# Patient Record
Sex: Female | Born: 1995
Health system: Southern US, Community
[De-identification: ages and names within clinical notes are randomized; demographics above are authoritative.]

## PROBLEM LIST (undated history)

## (undated) DIAGNOSIS — F329 Major depressive disorder, single episode, unspecified: Secondary | ICD-10-CM

## (undated) DIAGNOSIS — D649 Anemia, unspecified: Secondary | ICD-10-CM

## (undated) DIAGNOSIS — F919 Conduct disorder, unspecified: Secondary | ICD-10-CM

## (undated) DIAGNOSIS — F319 Bipolar disorder, unspecified: Secondary | ICD-10-CM

## (undated) DIAGNOSIS — F32A Depression, unspecified: Secondary | ICD-10-CM

## (undated) DIAGNOSIS — J45909 Unspecified asthma, uncomplicated: Secondary | ICD-10-CM

## (undated) DIAGNOSIS — R4689 Other symptoms and signs involving appearance and behavior: Secondary | ICD-10-CM

## (undated) DIAGNOSIS — F913 Oppositional defiant disorder: Secondary | ICD-10-CM

## (undated) DIAGNOSIS — E039 Hypothyroidism, unspecified: Secondary | ICD-10-CM

## (undated) DIAGNOSIS — F419 Anxiety disorder, unspecified: Secondary | ICD-10-CM

## (undated) DIAGNOSIS — T50901A Poisoning by unspecified drugs, medicaments and biological substances, accidental (unintentional), initial encounter: Secondary | ICD-10-CM

## (undated) HISTORY — PX: UPPER GI ENDOSCOPY: SHX6162

---
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
## 2017-02-21 ENCOUNTER — Inpatient Hospital Stay (HOSPITAL_COMMUNITY)
Admission: EM | Admit: 2017-02-21 | Discharge: 2017-02-23 | DRG: 918 | Disposition: A | Discharge status: Other Acute Inpatient Hospital | Payer: Medicaid Other | Attending: Internal Medicine | Admitting: Internal Medicine

## 2017-02-21 ENCOUNTER — Encounter (HOSPITAL_COMMUNITY): Payer: Self-pay | Admitting: *Deleted

## 2017-02-21 DIAGNOSIS — T50902A Poisoning by unspecified drugs, medicaments and biological substances, intentional self-harm, initial encounter: Secondary | ICD-10-CM | POA: Diagnosis not present

## 2017-02-21 DIAGNOSIS — T426X2A Poisoning by other antiepileptic and sedative-hypnotic drugs, intentional self-harm, initial encounter: Principal | ICD-10-CM | POA: Diagnosis present

## 2017-02-21 DIAGNOSIS — F4310 Post-traumatic stress disorder, unspecified: Secondary | ICD-10-CM | POA: Diagnosis present

## 2017-02-21 DIAGNOSIS — S134XXA Sprain of ligaments of cervical spine, initial encounter: Secondary | ICD-10-CM

## 2017-02-21 DIAGNOSIS — Z91018 Allergy to other foods: Secondary | ICD-10-CM

## 2017-02-21 DIAGNOSIS — Z881 Allergy status to other antibiotic agents status: Secondary | ICD-10-CM

## 2017-02-21 DIAGNOSIS — Z880 Allergy status to penicillin: Secondary | ICD-10-CM

## 2017-02-21 DIAGNOSIS — T50901A Poisoning by unspecified drugs, medicaments and biological substances, accidental (unintentional), initial encounter: Secondary | ICD-10-CM

## 2017-02-21 DIAGNOSIS — F603 Borderline personality disorder: Secondary | ICD-10-CM | POA: Diagnosis present

## 2017-02-21 DIAGNOSIS — Z9102 Food additives allergy status: Secondary | ICD-10-CM

## 2017-02-21 DIAGNOSIS — F319 Bipolar disorder, unspecified: Secondary | ICD-10-CM | POA: Diagnosis present

## 2017-02-21 DIAGNOSIS — F17210 Nicotine dependence, cigarettes, uncomplicated: Secondary | ICD-10-CM | POA: Diagnosis present

## 2017-02-21 DIAGNOSIS — Z915 Personal history of self-harm: Secondary | ICD-10-CM

## 2017-02-21 DIAGNOSIS — Z91010 Allergy to peanuts: Secondary | ICD-10-CM

## 2017-02-21 LAB — CBC WITH DIFFERENTIAL/PLATELET
Basophils Absolute: 0.1 10*3/uL (ref 0.0–0.1)
Basophils Relative: 0 %
EOS PCT: 0 %
Eosinophils Absolute: 0.1 10*3/uL (ref 0.0–0.7)
HEMATOCRIT: 44.5 % (ref 36.0–46.0)
Hemoglobin: 15 g/dL (ref 12.0–15.0)
LYMPHS ABS: 1.8 10*3/uL (ref 0.7–4.0)
LYMPHS PCT: 10 %
MCH: 28.8 pg (ref 26.0–34.0)
MCHC: 33.7 g/dL (ref 30.0–36.0)
MCV: 85.4 fL (ref 78.0–100.0)
MONO ABS: 0.5 10*3/uL (ref 0.1–1.0)
MONOS PCT: 3 %
NEUTROS ABS: 16 10*3/uL — AB (ref 1.7–7.7)
Neutrophils Relative %: 87 %
PLATELETS: 235 10*3/uL (ref 150–400)
RBC: 5.21 MIL/uL — ABNORMAL HIGH (ref 3.87–5.11)
RDW: 13 % (ref 11.5–15.5)
WBC: 18.4 10*3/uL — ABNORMAL HIGH (ref 4.0–10.5)

## 2017-02-21 LAB — COMPREHENSIVE METABOLIC PANEL
ALT: 19 U/L (ref 14–54)
AST: 50 U/L — AB (ref 15–41)
Albumin: 4.9 g/dL (ref 3.5–5.0)
Alkaline Phosphatase: 87 U/L (ref 38–126)
Anion gap: 13 (ref 5–15)
BUN: 10 mg/dL (ref 6–20)
CALCIUM: 9.5 mg/dL (ref 8.9–10.3)
CO2: 21 mmol/L — AB (ref 22–32)
CREATININE: 0.73 mg/dL (ref 0.44–1.00)
Chloride: 106 mmol/L (ref 101–111)
GFR calc Af Amer: >60 mL/min (ref >60)
GFR calc non Af Amer: >60 mL/min (ref >60)
GLUCOSE: 95 mg/dL (ref 65–99)
Potassium: 4.4 mmol/L (ref 3.5–5.1)
Sodium: 140 mmol/L (ref 135–145)
Total Bilirubin: 0.2 mg/dL — ABNORMAL LOW (ref 0.3–1.2)
Total Protein: 8.6 g/dL — ABNORMAL HIGH (ref 6.5–8.1)

## 2017-02-21 LAB — RAPID URINE DRUG SCREEN, HOSP PERFORMED
AMPHETAMINES: NOT DETECTED
Barbiturates: NOT DETECTED
Benzodiazepines: NOT DETECTED
Cocaine: NOT DETECTED
OPIATES: NOT DETECTED
TETRAHYDROCANNABINOL: POSITIVE — AB

## 2017-02-21 LAB — I-STAT BETA HCG BLOOD, ED (MC, WL, AP ONLY): I-stat hCG, quantitative: <5 m[IU]/mL (ref <5)

## 2017-02-21 LAB — ACETAMINOPHEN LEVEL: Acetaminophen (Tylenol), Serum: <10 ug/mL — ABNORMAL LOW (ref 10–30)

## 2017-02-21 LAB — SALICYLATE LEVEL: Salicylate Lvl: <7 mg/dL (ref 2.8–30.0)

## 2017-02-21 MED ORDER — KETOROLAC TROMETHAMINE 30 MG/ML IJ SOLN
30.0000 mg | Freq: Once | INTRAMUSCULAR | Status: AC
  Administered 2017-02-21: 30 mg via INTRAVENOUS
  Filled 2017-02-21: qty 1

## 2017-02-21 MED ORDER — SODIUM CHLORIDE 0.9 % IV BOLUS (SEPSIS)
1000.0000 mL | Freq: Once | INTRAVENOUS | Status: AC
  Administered 2017-02-21: 1000 mL via INTRAVENOUS

## 2017-02-21 NOTE — ED Notes (Signed)
Bed: WA08 Expected date:  Expected time:  Means of arrival:  Comments: OD on lamictal

## 2017-02-21 NOTE — ED Notes (Signed)
Pt. Made aware for the need of urine specimen. 

## 2017-02-21 NOTE — H&P (Addendum)
History and Physical    Kerri Parks WUJ:[id number] DOB: 14-Mar-1996 DOA: 02/21/2017  PCP: Patient, No Pcp Per  Patient coming from: Home  I have personally briefly reviewed patient's old medical records in [facility]  Chief Complaint: Overdose  HPI: Kerri Parks is a 21 y.o. female who identifies as female and goes by the name "Kerri Parks" with medical history significant for bipolar 1 disorder with multiple prior suicide attempts who presents to the ED via EMS after taking 2 whole bottles Lamictal after he had a fight with his girlfriend. Patient had one episode of emesis prior to presentation to the ED. Patient admits to prior suicide attempts and multiple prior psychiatric hospitalizations. He was hospitalized approximately 1 year ago for an intentional overdose of lithium and ziprasidone. He admits that the overdose was intentional. The patient notes frequent suicidal thoughts and admits to feeling depressed "most the time." At the time of presentation, he endorses weakness, fatigue and mild nausea. He also notes mild shortness of breath. He denies fever, chills, chest pain or palpitations, seizure, abdominal discomfort, urinary changes. He has no other complaints at this time. Although valproic acid is noted on the patient's medication list, he reports that he is no longer taking this medication, nor any other medications other than Lamictal. Poison control was contacted who felt that telemetry and close monitoring for seizure activity were indicated.  ED Course: In the ED, EKG showed normal sinus rhythm with mildly prolonged QTC of 484. Labs notable for WBC 18.4, Hgb 15. Renal and liver function normal. Tylenol and salicylate levels within normal limits. HCG negative. UDS pending at the time of assessment.  Review of Systems: As per HPI otherwise 10 point review of systems negative.   Past Medical History:  Diagnosis Date  . Anemia   . Asthma   . Bipolar 1 disorder (HCC)   .  Conduct disorder   . Depression   . Hypothyroidism 08/05/2013   Per pt report  . Oppositional defiant behavior   . Overdose 03/07/2016   intential     Past Surgical History:  Procedure Laterality Date  . UPPER GI ENDOSCOPY       reports that she has been smoking Cigarettes.  She has never used smokeless tobacco. She reports that she drinks alcohol. She reports that she uses drugs, including Marijuana.  Allergies  Allergen Reactions  . Peanut-Containing Drug Products Anaphylaxis  . Penicillins Other (See Comments)    Reaction:  Unknown Has patient had a PCN reaction causing immediate rash, facial/tongue/throat swelling, SOB or lightheadedness with hypotension: Unsure Has patient had a PCN reaction causing severe rash involving mucus membranes or skin necrosis: Unsure Has patient had a PCN reaction that required hospitalization Unsure Has patient had a PCN reaction occurring within the last 10 years: No If all of the above answers are "NO", then may proceed with Cephalosporin use.  Marland Kitchen Amoxicillin Other (See Comments)    Reaction:  Unknown  Has patient had a PCN reaction causing immediate rash, facial/tongue/throat swelling, SOB or lightheadedness with hypotension: Unsure Has patient had a PCN reaction causing severe rash involving mucus membranes or skin necrosis: Unsure Has patient had a PCN reaction that required hospitalization Unsure Has patient had a PCN reaction occurring within the last 10 years: No If all of the above answers are "NO", then may proceed with Cephalosporin use.  . Food Other (See Comments)    Pt is allergic to all melons and nuts.   Reaction:  Unknown     Family History  Problem Relation Age of Onset  . Family history unknown: Yes   Prior to Admission medications   Medication Sig Start Date End Date Taking? Authorizing Provider  lamoTRIgine (LAMICTAL) 25 MG tablet Take 25 mg by mouth daily.   Yes [provider]  divalproex (DEPAKOTE) 250 MG DR  tablet Take 1 tablet (250 mg total) by mouth every 12 (twelve) hours. May increase to 500 mg BID in 7 days Patient not taking: Reported on 02/21/2017 07/25/16   Charm Rings, NP  ibuprofen (ADVIL,MOTRIN) 600 MG tablet Take 1 tablet (600 mg total) by mouth every 6 (six) hours as needed. Patient not taking: Reported on 02/21/2017 12/23/16   Antony Madura, PA-C  omeprazole (PRILOSEC) 20 MG capsule Take 1 capsule (20 mg total) by mouth daily. Patient not taking: Reported on 02/21/2017 07/02/16   Demetrios Loll T, PA-C  sucralfate (CARAFATE) 1 GM/10ML suspension Take 10 mLs (1 g total) by mouth 4 (four) times daily -  with meals and at bedtime. Patient not taking: Reported on 02/21/2017 07/02/16   Rise Mu, PA-C    Physical Exam: Vitals:   02/21/17 1952 02/21/17 2025  BP: (!) 141/102   Pulse: (!) 103   Resp: 16   Temp: 97.9 F (36.6 C)   TempSrc: Oral   SpO2: 100% 97%    Constitutional: NAD, calm, comfortable Vitals:   02/21/17 1952 02/21/17 2025  BP: (!) 141/102   Pulse: (!) 103   Resp: 16   Temp: 97.9 F (36.6 C)   TempSrc: Oral   SpO2: 100% 97%   Eyes: PERRL, lids and conjunctivae normal ENMT: Mucous membranes are moist. Posterior pharynx clear of any exudate or lesions. Normal dentition.  Neck: normal, supple, no masses Respiratory: clear to auscultation bilaterally, no wheezing, no crackles. Normal respiratory effort. No accessory muscle use.  Cardiovascular: Regular rate and rhythm, no murmurs / rubs / gallops. No extremity edema. 2+ pedal pulses. No carotid bruits.  Abdomen: no tenderness, no masses palpated. No hepatosplenomegaly. Bowel sounds positive.  Musculoskeletal: no clubbing / cyanosis. No joint deformity upper and lower extremities. Good ROM, no contractures. Normal muscle tone.  Skin: no rashes, lesions, ulcers. No induration Neurologic: CN 2-12 grossly intact. Sensation intact, DTR normal. Strength 5/5 in all 4.  Psychiatric: Alert and oriented x 3.  Depressed, withdrawn  Labs on Admission: I have personally reviewed following labs and imaging studies  CBC:  Recent Labs Lab 02/21/17 2015  WBC 18.4*  NEUTROABS 16.0*  HGB 15.0  HCT 44.5  MCV 85.4  PLT 235   Basic Metabolic Panel:  Recent Labs Lab 02/21/17 2015  NA 140  K 4.4  CL 106  CO2 21*  GLUCOSE 95  BUN 10  CREATININE 0.73  CALCIUM 9.5   GFR: CrCl cannot be calculated (Unknown ideal weight.). Liver Function Tests:  Recent Labs Lab 02/21/17 2015  AST 50*  ALT 19  ALKPHOS 87  BILITOT 0.2*  PROT 8.6*  ALBUMIN 4.9   No results for input(s): LIPASE, AMYLASE in the last 168 hours. No results for input(s): AMMONIA in the last 168 hours. Coagulation Profile: No results for input(s): INR, PROTIME in the last 168 hours. Cardiac Enzymes: No results for input(s): CKTOTAL, CKMB, CKMBINDEX, TROPONINI in the last 168 hours. BNP (last 3 results) No results for input(s): PROBNP in the last 8760 hours. HbA1C: No results for input(s): HGBA1C in the last 72 hours. CBG: No results for input(s):  GLUCAP in the last 168 hours. Lipid Profile: No results for input(s): CHOL, HDL, LDLCALC, TRIG, CHOLHDL, LDLDIRECT in the last 72 hours. Thyroid Function Tests: No results for input(s): TSH, T4TOTAL, FREET4, T3FREE, THYROIDAB in the last 72 hours. Anemia Panel: No results for input(s): VITAMINB12, FOLATE, FERRITIN, TIBC, IRON, RETICCTPCT in the last 72 hours. Urine analysis:    Component Value Date/Time   COLORURINE YELLOW 07/02/2016 0744   APPEARANCEUR HAZY (A) 07/02/2016 0744   LABSPEC 1.029 07/02/2016 0744   PHURINE 5.0 07/02/2016 0744   GLUCOSEU NEGATIVE 07/02/2016 0744   HGBUR NEGATIVE 07/02/2016 0744   BILIRUBINUR NEGATIVE 07/02/2016 0744   KETONESUR 20 (A) 07/02/2016 0744   PROTEINUR 30 (A) 07/02/2016 0744   UROBILINOGEN 0.2 05/12/2012 1645   NITRITE NEGATIVE 07/02/2016 0744   LEUKOCYTESUR NEGATIVE 07/02/2016 0744    Radiological Exams on Admission: No  results found.  EKG: Independently reviewed. NSR with rate of 88, QTc 484  Assessment/Plan Active Problems:   Intentional overdose of drug in tablet form (HCC)  1. Intentional overdose of lamotrigine 2. History of Bipolar 1 disorder with multiple prior suicide attempts - Consult Psych in AM - Suicide precautions, sitter at bedside - Monitor on telemetry - Monitor for seizure activity, Ativan PRN if witnessed seizure - Follow up UDS - Check HIV, hepatitis panel - AM BMP, replete lytes PRN  3. Reported history of hypothyroidism On chart review, there was mildly elevated TSH in 02/2016. Pt previously on Synthroid but since discontinued. Evidence is not overwhelmingly suggestive of hypothyroidism and prior mild elevation was potentially spurious in setting of lithium intoxication. - Repeat TSH  DVT prophylaxis: Lovenox Code Status: Full Code Family Communication: None Disposition Plan: Psych eval; anticipate home versus inpatient Psychiatry Consults called: None Admission status: Observation   Marcelo Baldy MD [facility] Pager [phone]  If 7PM-7AM, please contact night-coverage [email or web address] Password [email or web address]  02/22/2017, 12:00 AM

## 2017-02-21 NOTE — ED Provider Notes (Addendum)
[facility]-EMERGENCY DEPT Provider Note   CSN: [id number] Arrival date & time: 02/21/17  1944     History   Chief Complaint No chief complaint on file.   HPI Kerri Parks is a 21 y.o. female.  Patient was brought in to the emergency department because she took 25 lobectomy pills that were each 25 mg in an attempt to kill herself. This occurred around 7 PM.   The history is provided by the patient and the EMS personnel. No language interpreter was used.  Ingestion  This is a new problem. The current episode started 1 to 2 hours ago. The problem occurs rarely. The problem has been resolved. Pertinent negatives include no chest pain, no abdominal pain and no headaches. Nothing aggravates the symptoms. Nothing relieves the symptoms. She has tried nothing for the symptoms. The treatment provided no relief.    Past Medical History:  Diagnosis Date  . Anemia   . Asthma   . Bipolar 1 disorder (HCC)   . Conduct disorder   . Depression   . Hypothyroidism 08/05/2013   Per pt report  . Oppositional defiant behavior   . Overdose 03/07/2016   intential     Patient Active Problem List   Diagnosis Date Noted  . Suicidal ideation   . Bipolar affective disorder, depressed, mild (HCC) 03/09/2016  . Overdose 03/07/2016  . Encounter for central line placement   . Lithium toxicity   . Prolonged QT interval   . Suicide attempt (HCC)   . Food allergy 11/08/2015  . Foster child 11/08/2015  . Iron deficiency anemia 11/08/2015  . Asthma, chronic 08/19/2013    Past Surgical History:  Procedure Laterality Date  . UPPER GI ENDOSCOPY      OB History    No data available       Home Medications    Prior to Admission medications   Medication Sig Start Date End Date Taking? Authorizing Provider  divalproex (DEPAKOTE) 250 MG DR tablet Take 1 tablet (250 mg total) by mouth every 12 (twelve) hours. May increase to 500 mg BID in 7 days 07/25/16   Charm Rings, NP  ibuprofen  (ADVIL,MOTRIN) 600 MG tablet Take 1 tablet (600 mg total) by mouth every 6 (six) hours as needed. 12/23/16   Antony Madura, PA-C  omeprazole (PRILOSEC) 20 MG capsule Take 1 capsule (20 mg total) by mouth daily. 07/02/16   Rise Mu, PA-C  sucralfate (CARAFATE) 1 GM/10ML suspension Take 10 mLs (1 g total) by mouth 4 (four) times daily -  with meals and at bedtime. 07/02/16   Rise Mu, PA-C    Family History Family History  Problem Relation Age of Onset  . Family history unknown: Yes    Social History Social History  Substance Use Topics  . Smoking status: Current Every Day Smoker    Types: Cigarettes  . Smokeless tobacco: Never Used  . Alcohol use Yes     Comment: in the past     Allergies   Peanut-containing drug products; Penicillins; Amoxicillin; and Food   Review of Systems Review of Systems  Constitutional: Negative for appetite change and fatigue.  HENT: Negative for congestion, ear discharge and sinus pressure.   Eyes: Negative for discharge.  Respiratory: Negative for cough.   Cardiovascular: Negative for chest pain.  Gastrointestinal: Negative for abdominal pain and diarrhea.  Genitourinary: Negative for frequency and hematuria.  Musculoskeletal: Negative for back pain.  Skin: Negative for rash.  Neurological: Negative for seizures  and headaches.  Psychiatric/Behavioral: Positive for dysphoric mood and suicidal ideas. Negative for hallucinations.     Physical Exam Updated Vital Signs BP (!) 141/102 (BP Location: Left Arm)   Pulse (!) 103   Temp 97.9 F (36.6 C) (Oral)   Resp 16   LMP 02/03/2017 (Approximate)   SpO2 97%   Physical Exam  Constitutional: She is oriented to person, place, and time. She appears well-developed.  HENT:  Head: Normocephalic.  Eyes: Conjunctivae and EOM are normal. No scleral icterus.  Neck: Neck supple. No thyromegaly present.  Cardiovascular: Normal rate and regular rhythm.  Exam reveals no gallop and no  friction rub.   No murmur heard. Pulmonary/Chest: No stridor. She has no wheezes. She has no rales. She exhibits no tenderness.  Abdominal: She exhibits no distension. There is no tenderness. There is no rebound.  Musculoskeletal: Normal range of motion. She exhibits no edema.  Lymphadenopathy:    She has no cervical adenopathy.  Neurological: She is oriented to person, place, and time. She exhibits normal muscle tone. Coordination normal.  Patient moderately lethargic  Skin: No rash noted. No erythema.  Psychiatric:  Patient depressed but she states she's not suicidal now.     ED Treatments / Results  Labs (all labs ordered are listed, but only abnormal results are displayed) Labs Reviewed  CBC WITH DIFFERENTIAL/PLATELET - Abnormal; Notable for the following:       Result Value   WBC 18.4 (*)    RBC 5.21 (*)    Neutro Abs 16.0 (*)    All other components within normal limits  COMPREHENSIVE METABOLIC PANEL - Abnormal; Notable for the following:    CO2 21 (*)    Total Protein 8.6 (*)    AST 50 (*)    Total Bilirubin 0.2 (*)    All other components within normal limits  ACETAMINOPHEN LEVEL - Abnormal; Notable for the following:    Acetaminophen (Tylenol), Serum <10 (*)    All other components within normal limits  SALICYLATE LEVEL  RAPID URINE DRUG SCREEN, HOSP PERFORMED  I-STAT BETA HCG BLOOD, ED ([facility], [facility], [facility] ONLY)    EKG  EKG Interpretation None       Radiology No results found.  Procedures Procedures (including critical care time)  Medications Ordered in ED Medications  sodium chloride 0.9 % bolus 1,000 mL (1,000 mLs Intravenous New Bag/Given 02/21/17 2135)  ketorolac (TORADOL) 30 MG/ML injection 30 mg (30 mg Intravenous Given 02/21/17 2135)     Initial Impression / Assessment and Plan / ED Course  I have reviewed the triage vital signs and the nursing notes.  Pertinent labs & imaging results that were available during my care of the patient were reviewed  by me and considered in my medical decision making (see chart for details).    CRITICAL CARE Performed by: Campbell Kray L Total critical care time: 35 minutes Critical care time was exclusive of separately billable procedures and treating other patients. Critical care was necessary to treat or prevent imminent or life-threatening deterioration. Critical care was time spent personally by me on the following activities: development of treatment plan with patient and/or surrogate as well as nursing, discussions with consultants, evaluation of patient's response to treatment, examination of patient, obtaining history from patient or surrogate, ordering and performing treatments and interventions, ordering and review of laboratory studies, ordering and review of radiographic studies, pulse oximetry and re-evaluation of patient's condition.   Patient is a 25 lamictal pills in an attempt to  kill himself today.  Poison control suggested observation for 12 hours watching out for seizures lethargy and EKG changes  Final Clinical Impressions(s) / ED Diagnoses   Final diagnoses:  Accidental drug overdose, initial encounter    New Prescriptions New Prescriptions   No medications on file     Bethann Berkshire, MD 02/21/17 2252    Bethann Berkshire, MD 02/21/17 2254

## 2017-02-21 NOTE — ED Triage Notes (Signed)
Pt BIB EMS, about 1830 attempted intentional OD on Lamictal 25 mg. He took approximate 25 of 25 mg dose of Lamictal. He claims there were 2 bottles of this medication. Only 1 bottle found by [employer] and EMS. There was some emesis with pill fragments by the bed. Friends states this was over a girl and he too admitted to this.  C/o generalized weakness, unsteady ambulating. Per EMS, he has had  One episode of emesis and nausea. C/o dyspnea but not assessed by EMS.

## 2017-02-21 NOTE — ED Notes (Signed)
EKG given to EDP,Zammit,MD., for review. 

## 2017-02-22 ENCOUNTER — Encounter (HOSPITAL_COMMUNITY): Payer: Self-pay | Admitting: *Deleted

## 2017-02-22 ENCOUNTER — Observation Stay (HOSPITAL_COMMUNITY): Payer: Medicaid Other

## 2017-02-22 DIAGNOSIS — Z630 Problems in relationship with spouse or partner: Secondary | ICD-10-CM | POA: Diagnosis not present

## 2017-02-22 DIAGNOSIS — F319 Bipolar disorder, unspecified: Secondary | ICD-10-CM | POA: Diagnosis present

## 2017-02-22 DIAGNOSIS — Z881 Allergy status to other antibiotic agents status: Secondary | ICD-10-CM | POA: Diagnosis not present

## 2017-02-22 DIAGNOSIS — Z653 Problems related to other legal circumstances: Secondary | ICD-10-CM | POA: Diagnosis not present

## 2017-02-22 DIAGNOSIS — F603 Borderline personality disorder: Secondary | ICD-10-CM | POA: Diagnosis present

## 2017-02-22 DIAGNOSIS — S134XXA Sprain of ligaments of cervical spine, initial encounter: Secondary | ICD-10-CM

## 2017-02-22 DIAGNOSIS — T50902A Poisoning by unspecified drugs, medicaments and biological substances, intentional self-harm, initial encounter: Secondary | ICD-10-CM

## 2017-02-22 DIAGNOSIS — F1210 Cannabis abuse, uncomplicated: Secondary | ICD-10-CM | POA: Diagnosis not present

## 2017-02-22 DIAGNOSIS — T426X2A Poisoning by other antiepileptic and sedative-hypnotic drugs, intentional self-harm, initial encounter: Secondary | ICD-10-CM | POA: Diagnosis present

## 2017-02-22 DIAGNOSIS — F410 Panic disorder [episodic paroxysmal anxiety] without agoraphobia: Secondary | ICD-10-CM | POA: Diagnosis not present

## 2017-02-22 DIAGNOSIS — F1410 Cocaine abuse, uncomplicated: Secondary | ICD-10-CM | POA: Diagnosis not present

## 2017-02-22 DIAGNOSIS — R454 Irritability and anger: Secondary | ICD-10-CM | POA: Diagnosis not present

## 2017-02-22 DIAGNOSIS — Z91010 Allergy to peanuts: Secondary | ICD-10-CM | POA: Diagnosis not present

## 2017-02-22 DIAGNOSIS — Z9102 Food additives allergy status: Secondary | ICD-10-CM | POA: Diagnosis not present

## 2017-02-22 DIAGNOSIS — F39 Unspecified mood [affective] disorder: Secondary | ICD-10-CM | POA: Diagnosis not present

## 2017-02-22 DIAGNOSIS — R4584 Anhedonia: Secondary | ICD-10-CM | POA: Diagnosis not present

## 2017-02-22 DIAGNOSIS — R450 Nervousness: Secondary | ICD-10-CM | POA: Diagnosis not present

## 2017-02-22 DIAGNOSIS — F314 Bipolar disorder, current episode depressed, severe, without psychotic features: Secondary | ICD-10-CM | POA: Diagnosis not present

## 2017-02-22 DIAGNOSIS — G4700 Insomnia, unspecified: Secondary | ICD-10-CM | POA: Diagnosis not present

## 2017-02-22 DIAGNOSIS — Z915 Personal history of self-harm: Secondary | ICD-10-CM | POA: Diagnosis not present

## 2017-02-22 DIAGNOSIS — F17210 Nicotine dependence, cigarettes, uncomplicated: Secondary | ICD-10-CM | POA: Diagnosis present

## 2017-02-22 DIAGNOSIS — T50901A Poisoning by unspecified drugs, medicaments and biological substances, accidental (unintentional), initial encounter: Secondary | ICD-10-CM

## 2017-02-22 DIAGNOSIS — R1110 Vomiting, unspecified: Secondary | ICD-10-CM | POA: Diagnosis not present

## 2017-02-22 DIAGNOSIS — Z91018 Allergy to other foods: Secondary | ICD-10-CM | POA: Diagnosis not present

## 2017-02-22 DIAGNOSIS — F1910 Other psychoactive substance abuse, uncomplicated: Secondary | ICD-10-CM | POA: Diagnosis not present

## 2017-02-22 DIAGNOSIS — F4310 Post-traumatic stress disorder, unspecified: Secondary | ICD-10-CM | POA: Diagnosis present

## 2017-02-22 DIAGNOSIS — Z880 Allergy status to penicillin: Secondary | ICD-10-CM | POA: Diagnosis not present

## 2017-02-22 DIAGNOSIS — F419 Anxiety disorder, unspecified: Secondary | ICD-10-CM | POA: Diagnosis not present

## 2017-02-22 DIAGNOSIS — T1491XA Suicide attempt, initial encounter: Secondary | ICD-10-CM | POA: Diagnosis not present

## 2017-02-22 LAB — COMPREHENSIVE METABOLIC PANEL
ALBUMIN: 4.5 g/dL (ref 3.5–5.0)
ALK PHOS: 84 U/L (ref 38–126)
ALT: 19 U/L (ref 14–54)
ANION GAP: 10 (ref 5–15)
AST: 41 U/L (ref 15–41)
BUN: 12 mg/dL (ref 6–20)
CALCIUM: 9.5 mg/dL (ref 8.9–10.3)
CHLORIDE: 106 mmol/L (ref 101–111)
CO2: 22 mmol/L (ref 22–32)
Creatinine, Ser: 0.7 mg/dL (ref 0.44–1.00)
GFR calc non Af Amer: >60 mL/min (ref >60)
GLUCOSE: 87 mg/dL (ref 65–99)
POTASSIUM: 4.1 mmol/L (ref 3.5–5.1)
SODIUM: 138 mmol/L (ref 135–145)
Total Bilirubin: 0.7 mg/dL (ref 0.3–1.2)
Total Protein: 7.8 g/dL (ref 6.5–8.1)

## 2017-02-22 LAB — CBC
HEMATOCRIT: 40.1 % (ref 36.0–46.0)
HEMOGLOBIN: 13.6 g/dL (ref 12.0–15.0)
MCH: 29 pg (ref 26.0–34.0)
MCHC: 33.9 g/dL (ref 30.0–36.0)
MCV: 85.5 fL (ref 78.0–100.0)
Platelets: 262 10*3/uL (ref 150–400)
RBC: 4.69 MIL/uL (ref 3.87–5.11)
RDW: 13.2 % (ref 11.5–15.5)
WBC: 15.4 10*3/uL — ABNORMAL HIGH (ref 4.0–10.5)

## 2017-02-22 LAB — TSH: TSH: 1.66 u[IU]/mL (ref 0.350–4.500)

## 2017-02-22 LAB — VALPROIC ACID LEVEL

## 2017-02-22 MED ORDER — ENOXAPARIN SODIUM 40 MG/0.4ML ~~LOC~~ SOLN
40.0000 mg | SUBCUTANEOUS | Status: DC
Start: 2017-02-22 — End: 2017-02-23
  Administered 2017-02-22 – 2017-02-23 (×2): 40 mg via SUBCUTANEOUS
  Filled 2017-02-22 (×2): qty 0.4

## 2017-02-22 MED ORDER — ONDANSETRON HCL 4 MG/2ML IJ SOLN
4.0000 mg | Freq: Four times a day (QID) | INTRAMUSCULAR | Status: DC
Start: 2017-02-22 — End: 2017-02-23
  Administered 2017-02-22 – 2017-02-23 (×4): 4 mg via INTRAVENOUS
  Filled 2017-02-22 (×4): qty 2

## 2017-02-22 MED ORDER — KETOROLAC TROMETHAMINE 30 MG/ML IJ SOLN
30.0000 mg | Freq: Once | INTRAMUSCULAR | Status: AC
Start: 2017-02-22 — End: 2017-02-22
  Administered 2017-02-22: 30 mg via INTRAVENOUS
  Filled 2017-02-22: qty 1
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
## 2017-02-22 NOTE — ED Notes (Signed)
Pt refused to change into the hospital attire. Admitting doctor made aware. IVC paper work in progress. Pt made aware of the IVC process.

## 2017-02-22 NOTE — Clinical Social Work Note (Addendum)
CSW received consult for "Transfer to Inpatient Behavioral Health." Per MD, pt medically cleared for discharge. CSW confirmed a psych inpatient bed for pt at [facility] in room 402 bed 1. Per MD, pt to discharge on Sunday AM. CSW updated [facility] [place] who will hold bed for 24 hours for pt. Psych to see pt Sunday AM. Pt is IVC'd. IVC faxed to [facility] at [phone]. CSW continuing to follow for discharge needs.   Corlis Hove, LCSWA, LCASA Clinical Social Work-[employer] [phone]

## 2017-02-22 NOTE — ED Notes (Signed)
WILL TRANSPORT PT TO 5E 1516-1. AAOX4. IVC PAPERWORK. SITTER AT THE BEDSIDE. PT IN NO APPARENT DISTRESS OR PAIN. THE OPPORTUNITY TO ASK QUESTIONS WAS PROVIDED.

## 2017-02-23 ENCOUNTER — Encounter (HOSPITAL_COMMUNITY): Payer: Self-pay | Admitting: *Deleted

## 2017-02-23 ENCOUNTER — Inpatient Hospital Stay (HOSPITAL_COMMUNITY)
Admission: AD | Admit: 2017-02-23 | Discharge: 2017-02-26 | DRG: 885 | Disposition: A | Discharge status: Home / Self Care | Payer: Medicaid Other | Attending: Psychiatry | Admitting: Psychiatry

## 2017-02-23 DIAGNOSIS — F17210 Nicotine dependence, cigarettes, uncomplicated: Secondary | ICD-10-CM | POA: Diagnosis not present

## 2017-02-23 DIAGNOSIS — Z915 Personal history of self-harm: Secondary | ICD-10-CM | POA: Diagnosis not present

## 2017-02-23 DIAGNOSIS — F4310 Post-traumatic stress disorder, unspecified: Secondary | ICD-10-CM

## 2017-02-23 DIAGNOSIS — R1110 Vomiting, unspecified: Secondary | ICD-10-CM | POA: Diagnosis not present

## 2017-02-23 DIAGNOSIS — T426X2D Poisoning by other antiepileptic and sedative-hypnotic drugs, intentional self-harm, subsequent encounter: Secondary | ICD-10-CM

## 2017-02-23 DIAGNOSIS — F603 Borderline personality disorder: Secondary | ICD-10-CM | POA: Diagnosis present

## 2017-02-23 DIAGNOSIS — T426X2A Poisoning by other antiepileptic and sedative-hypnotic drugs, intentional self-harm, initial encounter: Principal | ICD-10-CM

## 2017-02-23 DIAGNOSIS — G2581 Restless legs syndrome: Secondary | ICD-10-CM | POA: Diagnosis present

## 2017-02-23 DIAGNOSIS — F419 Anxiety disorder, unspecified: Secondary | ICD-10-CM | POA: Diagnosis not present

## 2017-02-23 DIAGNOSIS — Z630 Problems in relationship with spouse or partner: Secondary | ICD-10-CM

## 2017-02-23 DIAGNOSIS — E039 Hypothyroidism, unspecified: Secondary | ICD-10-CM | POA: Diagnosis present

## 2017-02-23 DIAGNOSIS — W182XXD Fall in (into) shower or empty bathtub, subsequent encounter: Secondary | ICD-10-CM

## 2017-02-23 DIAGNOSIS — J45909 Unspecified asthma, uncomplicated: Secondary | ICD-10-CM | POA: Diagnosis not present

## 2017-02-23 DIAGNOSIS — F410 Panic disorder [episodic paroxysmal anxiety] without agoraphobia: Secondary | ICD-10-CM | POA: Diagnosis present

## 2017-02-23 DIAGNOSIS — T1491XA Suicide attempt, initial encounter: Secondary | ICD-10-CM

## 2017-02-23 DIAGNOSIS — Z653 Problems related to other legal circumstances: Secondary | ICD-10-CM | POA: Diagnosis not present

## 2017-02-23 DIAGNOSIS — R450 Nervousness: Secondary | ICD-10-CM | POA: Diagnosis not present

## 2017-02-23 DIAGNOSIS — G479 Sleep disorder, unspecified: Secondary | ICD-10-CM | POA: Diagnosis present

## 2017-02-23 DIAGNOSIS — T50902A Poisoning by unspecified drugs, medicaments and biological substances, intentional self-harm, initial encounter: Secondary | ICD-10-CM | POA: Diagnosis not present

## 2017-02-23 DIAGNOSIS — Y93E1 Activity, personal bathing and showering: Secondary | ICD-10-CM

## 2017-02-23 DIAGNOSIS — D509 Iron deficiency anemia, unspecified: Secondary | ICD-10-CM | POA: Diagnosis present

## 2017-02-23 DIAGNOSIS — Z9119 Patient's noncompliance with other medical treatment and regimen: Secondary | ICD-10-CM | POA: Diagnosis not present

## 2017-02-23 DIAGNOSIS — F1290 Cannabis use, unspecified, uncomplicated: Secondary | ICD-10-CM | POA: Diagnosis not present

## 2017-02-23 DIAGNOSIS — Z79899 Other long term (current) drug therapy: Secondary | ICD-10-CM | POA: Diagnosis not present

## 2017-02-23 DIAGNOSIS — F319 Bipolar disorder, unspecified: Principal | ICD-10-CM | POA: Diagnosis present

## 2017-02-23 DIAGNOSIS — F913 Oppositional defiant disorder: Secondary | ICD-10-CM | POA: Diagnosis present

## 2017-02-23 DIAGNOSIS — G4700 Insomnia, unspecified: Secondary | ICD-10-CM | POA: Diagnosis not present

## 2017-02-23 DIAGNOSIS — F39 Unspecified mood [affective] disorder: Secondary | ICD-10-CM | POA: Diagnosis not present

## 2017-02-23 DIAGNOSIS — F1410 Cocaine abuse, uncomplicated: Secondary | ICD-10-CM | POA: Diagnosis not present

## 2017-02-23 DIAGNOSIS — F1910 Other psychoactive substance abuse, uncomplicated: Secondary | ICD-10-CM | POA: Diagnosis not present

## 2017-02-23 DIAGNOSIS — R4584 Anhedonia: Secondary | ICD-10-CM | POA: Diagnosis not present

## 2017-02-23 DIAGNOSIS — Z23 Encounter for immunization: Secondary | ICD-10-CM | POA: Diagnosis not present

## 2017-02-23 DIAGNOSIS — F1210 Cannabis abuse, uncomplicated: Secondary | ICD-10-CM

## 2017-02-23 DIAGNOSIS — F314 Bipolar disorder, current episode depressed, severe, without psychotic features: Secondary | ICD-10-CM | POA: Diagnosis not present

## 2017-02-23 DIAGNOSIS — R454 Irritability and anger: Secondary | ICD-10-CM | POA: Diagnosis not present

## 2017-02-23 HISTORY — DX: Anxiety disorder, unspecified: F41.9

## 2017-02-23 LAB — HIV ANTIBODY (ROUTINE TESTING W REFLEX): HIV Screen 4th Generation wRfx: NONREACTIVE

## 2017-02-23 MED ORDER — MAGNESIUM HYDROXIDE 400 MG/5ML PO SUSP: 30.0000 mL | Freq: Every day | ORAL | Status: DC | PRN

## 2017-02-23 MED ORDER — ALUM & MAG HYDROXIDE-SIMETH 200-200-20 MG/5ML PO SUSP: 30.0000 mL | ORAL | Status: DC | PRN

## 2017-02-23 MED ORDER — NICOTINE POLACRILEX 2 MG MT GUM: 2.0000 mg | CHEWING_GUM | OROMUCOSAL | Status: DC | PRN

## 2017-02-23 MED ORDER — ACETAMINOPHEN 325 MG PO TABS
650.0000 mg | ORAL_TABLET | Freq: Four times a day (QID) | ORAL | Status: DC | PRN
  Administered 2017-02-24 (×2): 650 mg via ORAL
  Filled 2017-02-23 (×2): qty 2

## 2017-02-23 MED ORDER — INFLUENZA VAC SPLIT QUAD 0.5 ML IM SUSY
0.5000 mL | PREFILLED_SYRINGE | INTRAMUSCULAR | Status: AC
  Administered 2017-02-24: 0.5 mL via INTRAMUSCULAR
  Filled 2017-02-23: qty 0.5

## 2017-02-23 NOTE — Progress Notes (Signed)
[facility] MD Progress Note  02/23/2017 1:26 PM Kerri Parks  MRN:  [id number] Subjective:  Kerri Parks  Understands that ACT team may visit to come up with crisis plan.  Patient denies any acute SI, HI. Quite regretful about actions and overdose, and is working on a list of coping strategies. Feels that increased level of therapy would be helpful - has not had individual therapy visit in over 3 weeks.   He also now admits that he had drank 5 beers the day of overdose and this was a likely trigger.   Understands that [facility] bed is available and agrees for admission if ACT team is unable to present today. Otherwise, ACT team can visit tomorrow and work on safe discharge plans.  This is critical prior to discharge from [facility].  Principal Problem: Intentional overdose of drug in tablet form ([place]) Diagnosis:   Patient Active Problem List   Diagnosis Date Noted  . Borderline personality disorder ([place]) [F60.3] 02/22/2017  . Intentional overdose of drug in tablet form ([place]) [T50.902A] 02/21/2017  . Suicidal ideation [R45.851]   . Bipolar affective disorder, depressed, mild ([place]) [F31.31] 03/09/2016  . Overdose [T50.901A] 03/07/2016  . Encounter for central line placement [Z45.2]   . Lithium toxicity [T56.891A]   . Prolonged QT interval [R94.31]   . Suicide attempt ([place]) [T14.91XA]   . Food allergy [Z91.018] 11/08/2015  . Foster child [facility] 11/08/2015  . Iron deficiency anemia [D50.9] 11/08/2015  . Asthma, chronic [J45.909] 08/19/2013   Total Time spent with patient: 20 minutes  Past Psychiatric History: See intake H&P for full details. Reviewed, with no updates at this time.   Past Medical History:  Past Medical History:  Diagnosis Date  . Anemia   . Asthma   . Bipolar 1 disorder ([place])   . Conduct disorder   . Depression   . Hypothyroidism 08/05/2013   Per pt report  . Oppositional defiant behavior   . Overdose 03/07/2016   intential     Past Surgical History:  Procedure  Laterality Date  . UPPER GI ENDOSCOPY     Family History:  Family History  Problem Relation Age of Onset  . Family history unknown: Yes   Family Psychiatric  History: See intake H&P for full details. Reviewed, with no updates at this time.  Social History:  History  Alcohol Use  . Yes    Comment: in the past     History  Drug Use  . Types: Marijuana    Comment: "whenever she can get it"    Social History   Social History  . Marital status: Single    Spouse name: N/A  . Number of children: N/A  . Years of education: N/A   Social History Main Topics  . Smoking status: Current Every Day Smoker    Types: Cigarettes  . Smokeless tobacco: Never Used  . Alcohol use Yes     Comment: in the past  . Drug use: Yes    Types: Marijuana     Comment: "whenever she can get it"  . Sexual activity: Not Currently   Other Topics Concern  . None   Social History Narrative  . None   Additional Social History:                         Sleep: Fair  Appetite:  Fair  Current Medications: Current Facility-Administered Medications  Medication Dose Route Frequency Provider Last Rate Last Dose  .  enoxaparin (LOVENOX) injection 40 mg  40 mg Subcutaneous Q24H Bennie Pierini, MD   40 mg at 02/23/17 1212  . ondansetron (ZOFRAN) injection 4 mg  4 mg Intravenous Q6H Jani Gravel, MD   4 mg at 02/23/17 0510    Lab Results:  Results for orders placed or performed during the hospital encounter of 02/21/17 (from the past 48 hour(s))  CBC with Differential/Platelet     Status: Abnormal   Collection Time: 02/21/17  8:15 PM  Result Value Ref Range   WBC 18.4 (H) 4.0 - 10.5 K/uL   RBC 5.21 (H) 3.87 - 5.11 MIL/uL   Hemoglobin 15.0 12.0 - 15.0 g/dL   HCT 44.5 36.0 - 46.0 %   MCV 85.4 78.0 - 100.0 fL   MCH 28.8 26.0 - 34.0 pg   MCHC 33.7 30.0 - 36.0 g/dL   RDW 13.0 11.5 - 15.5 %   Platelets 235 150 - 400 K/uL   Neutrophils Relative % 87 %   Neutro Abs 16.0 (H) 1.7 - 7.7 K/uL    Lymphocytes Relative 10 %   Lymphs Abs 1.8 0.7 - 4.0 K/uL   Monocytes Relative 3 %   Monocytes Absolute 0.5 0.1 - 1.0 K/uL   Eosinophils Relative 0 %   Eosinophils Absolute 0.1 0.0 - 0.7 K/uL   Basophils Relative 0 %   Basophils Absolute 0.1 0.0 - 0.1 K/uL  Comprehensive metabolic panel     Status: Abnormal   Collection Time: 02/21/17  8:15 PM  Result Value Ref Range   Sodium 140 135 - 145 mmol/L   Potassium 4.4 3.5 - 5.1 mmol/L   Chloride 106 101 - 111 mmol/L   CO2 21 (L) 22 - 32 mmol/L   Glucose, Bld 95 65 - 99 mg/dL   BUN 10 6 - 20 mg/dL   Creatinine, Ser 0.73 0.44 - 1.00 mg/dL   Calcium 9.5 8.9 - 10.3 mg/dL   Total Protein 8.6 (H) 6.5 - 8.1 g/dL   Albumin 4.9 3.5 - 5.0 g/dL   AST 50 (H) 15 - 41 U/L   ALT 19 14 - 54 U/L   Alkaline Phosphatase 87 38 - 126 U/L   Total Bilirubin 0.2 (L) 0.3 - 1.2 mg/dL   GFR calc non Af Amer >60 >60 mL/min   GFR calc Af Amer >60 >60 mL/min    Comment: (NOTE) The eGFR has been calculated using the CKD EPI equation. This calculation has not been validated in all clinical situations. eGFR's persistently <60 mL/min signify possible Chronic Kidney Disease.    Anion gap 13 5 - 15  Acetaminophen level     Status: Abnormal   Collection Time: 02/21/17  8:15 PM  Result Value Ref Range   Acetaminophen (Tylenol), Serum <10 (L) 10 - 30 ug/mL    Comment:        THERAPEUTIC CONCENTRATIONS VARY SIGNIFICANTLY. A RANGE OF 10-30 ug/mL MAY BE AN EFFECTIVE CONCENTRATION FOR MANY PATIENTS. HOWEVER, SOME ARE BEST TREATED AT CONCENTRATIONS OUTSIDE THIS RANGE. ACETAMINOPHEN CONCENTRATIONS >150 ug/mL AT 4 HOURS AFTER INGESTION AND >50 ug/mL AT 12 HOURS AFTER INGESTION ARE OFTEN ASSOCIATED WITH TOXIC REACTIONS.   Salicylate level     Status: None   Collection Time: 02/21/17  8:15 PM  Result Value Ref Range   Salicylate Lvl <8.8 2.8 - 30.0 mg/dL  Rapid urine drug screen (hospital performed)     Status: Abnormal   Collection Time: 02/21/17  8:16 PM   Result Value Ref Range  Opiates NONE DETECTED NONE DETECTED   Cocaine NONE DETECTED NONE DETECTED   Benzodiazepines NONE DETECTED NONE DETECTED   Amphetamines NONE DETECTED NONE DETECTED   Tetrahydrocannabinol POSITIVE (A) NONE DETECTED   Barbiturates NONE DETECTED NONE DETECTED    Comment:        DRUG SCREEN FOR MEDICAL PURPOSES ONLY.  IF CONFIRMATION IS NEEDED FOR ANY PURPOSE, NOTIFY LAB WITHIN 5 DAYS.        LOWEST DETECTABLE LIMITS FOR URINE DRUG SCREEN Drug Class       Cutoff (ng/mL) Amphetamine      1000 Barbiturate      200 Benzodiazepine   326 Tricyclics       712 Opiates          300 Cocaine          300 THC              50   I-Stat Beta hCG blood, ED ([facility], [facility], [facility] only)     Status: None   Collection Time: 02/21/17  9:08 PM  Result Value Ref Range   I-stat hCG, quantitative <5.0 <5 mIU/mL   Comment 3            Comment:   GEST. AGE      CONC.  (mIU/mL)   <=1 WEEK        5 - 50     2 WEEKS       50 - 500     3 WEEKS       100 - 10,000     4 WEEKS     1,000 - 30,000        FEMALE AND NON-PREGNANT FEMALE:     LESS THAN 5 mIU/mL   TSH     Status: None   Collection Time: 02/22/17  5:39 AM  Result Value Ref Range   TSH 1.660 0.350 - 4.500 uIU/mL    Comment: Performed by a 3rd Generation assay with a functional sensitivity of <=0.01 uIU/mL.  HIV antibody (Routine Testing)     Status: None   Collection Time: 02/22/17  5:39 AM  Result Value Ref Range   HIV Screen 4th Generation wRfx Non Reactive Non Reactive    Comment: (NOTE) Performed At: [facility] [place], [place] [id number] Lindon Romp MD AS:[id number]   Comprehensive metabolic panel     Status: None   Collection Time: 02/22/17  5:39 AM  Result Value Ref Range   Sodium 138 135 - 145 mmol/L   Potassium 4.1 3.5 - 5.1 mmol/L   Chloride 106 101 - 111 mmol/L   CO2 22 22 - 32 mmol/L   Glucose, Bld 87 65 - 99 mg/dL   BUN 12 6 - 20 mg/dL   Creatinine, Ser 0.70 0.44 - 1.00 mg/dL    Calcium 9.5 8.9 - 10.3 mg/dL   Total Protein 7.8 6.5 - 8.1 g/dL   Albumin 4.5 3.5 - 5.0 g/dL   AST 41 15 - 41 U/L   ALT 19 14 - 54 U/L   Alkaline Phosphatase 84 38 - 126 U/L   Total Bilirubin 0.7 0.3 - 1.2 mg/dL   GFR calc non Af Amer >60 >60 mL/min   GFR calc Af Amer >60 >60 mL/min    Comment: (NOTE) The eGFR has been calculated using the CKD EPI equation. This calculation has not been validated in all clinical situations. eGFR's persistently <60 mL/min signify possible Chronic Kidney Disease.  Anion gap 10 5 - 15  CBC     Status: Abnormal   Collection Time: 02/22/17  5:39 AM  Result Value Ref Range   WBC 15.4 (H) 4.0 - 10.5 K/uL   RBC 4.69 3.87 - 5.11 MIL/uL   Hemoglobin 13.6 12.0 - 15.0 g/dL   HCT 40.1 36.0 - 46.0 %   MCV 85.5 78.0 - 100.0 fL   MCH 29.0 26.0 - 34.0 pg   MCHC 33.9 30.0 - 36.0 g/dL   RDW 13.2 11.5 - 15.5 %   Platelets 262 150 - 400 K/uL  Valproic acid level     Status: Abnormal   Collection Time: 02/22/17  5:39 AM  Result Value Ref Range   Valproic Acid Lvl <10 (L) 50.0 - 100.0 ug/mL    Blood Alcohol level:  Lab Results  Component Value Date   ETH <5 07/24/2016   ETH <5 43/56/8616    Metabolic Disorder Labs: Lab Results  Component Value Date   HGBA1C 5.4 05/31/2016   MPG 108 05/31/2016   MPG 105 03/28/2016   Lab Results  Component Value Date   PROLACTIN 20.5 05/31/2016   PROLACTIN 29.1 (H) 03/28/2016   Lab Results  Component Value Date   CHOL 122 05/31/2016   TRIG 33 05/31/2016   HDL 49 05/31/2016   CHOLHDL 2.5 05/31/2016   VLDL 7 05/31/2016   LDLCALC 66 05/31/2016   LDLCALC 95 03/28/2016    Physical Findings: AIMS:  , ,  ,  ,    CIWA:    COWS:     Musculoskeletal: Strength & Muscle Tone: within normal limits Gait & Station: normal Patient leans: N/A  Psychiatric Specialty Exam: Physical Exam  ROS  Blood pressure 120/71, pulse 61, temperature 98.5 F (36.9 C), temperature source Oral, resp. rate 16, height '5\' 3"'  (1.6  m), weight 63 kg (139 lb), last menstrual period 02/03/2017, SpO2 100 %.Body mass index is 24.62 kg/m.  General Appearance: Casual and Fairly Groomed  Eye Contact:  Fair  Speech:  Clear and Coherent  Volume:  Normal  Mood:  Anxious and Irritable  Affect:  Congruent  Thought Process:  Goal Directed  Orientation:  Full (Time, Place, and Person)  Thought Content:  Logical  Suicidal Thoughts:  No  Homicidal Thoughts:  No  Memory:  Immediate;   Fair  Judgement:  Fair  Insight:  Shallow  Psychomotor Activity:  Normal  Concentration:  Attention Span: Fair  Recall:  [employer] of Knowledge:  Fair  Language:  Fair  Akathisia:  Negative  Handed:  Right  AIMS (if indicated):     Assets:  Communication Skills Desire for Improvement Social Support  ADL's:  Intact  Cognition:  WNL  Sleep:   5-7 hours    Treatment Plan Summary: Kerri Parks Matilde Haymaker is a transgender female with borderline personality and PTSD.  Has ACT team services but would benefit from DBT and increased level of visits.  Overdosed on lamictal while intoxicated.  Is quite regretful.  Unable to discharge at this time, as safe disposition planning with ACT team is critical for dispo.  Patient has been accepted to [facility] and will go for psychiatric admission, unless ACT team is able to visit this afternoon to participate in dispo planning.    Aundra Dubin, MD 02/23/2017, 1:26 PM

## 2017-02-23 NOTE — Progress Notes (Signed)
At 1548 this afternoon, writer has given report to "Christa" at [facility].
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
## 2017-02-23 NOTE — Progress Notes (Signed)
Pt left with [employer], headed to [facility].
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
## 2017-02-23 NOTE — Plan of Care (Signed)
Problem: Coping: Goal: Ability to verbalize frustrations and anger appropriately will improve Outcome: Progressing Nurse discussed depression/anxiety/coping skills with patient.    

## 2017-02-23 NOTE — Tx Team (Signed)
Initial Treatment Plan 02/23/2017 8:08 PM [name] NGE:[id number]    PATIENT STRESSORS: Educational concerns Financial difficulties Health problems Legal issue Marital or family conflict Medication change or noncompliance Occupational concerns Substance abuse Traumatic event   PATIENT STRENGTHS: Average or above average intelligence Capable of independent living Communication skills Motivation for treatment/growth Supportive family/friends   PATIENT IDENTIFIED PROBLEMS: "suicidal thoughts"  "substance abuse"  "depression"  "anxiety"               DISCHARGE CRITERIA:  Ability to meet basic life and health needs Improved stabilization in mood, thinking, and/or behavior Medical problems require only outpatient monitoring Motivation to continue treatment in a less acute level of care Need for constant or close observation no longer present Reduction of life-threatening or endangering symptoms to within safe limits Safe-care adequate arrangements made Verbal commitment to aftercare and medication compliance Withdrawal symptoms are absent or subacute and managed without 24-hour nursing intervention  PRELIMINARY DISCHARGE PLAN: Attend aftercare/continuing care group Attend PHP/IOP Attend 12-step recovery group Outpatient therapy Return to previous living arrangement  PATIENT/FAMILY INVOLVEMENT: This treatment plan has been presented to and reviewed with the patient, Kerri Parks..  The patient and family have been given the opportunity to ask questions and make suggestions.  [name], RN 02/23/2017, 8:08 PM

## 2017-02-23 NOTE — Progress Notes (Signed)
Patient will discharge to [facility] Anticipated discharge date: 10/14 Transportation by [employer]- called at 2:55pm Report #[phone] Rm: 404 bed 1  CSW signing off.  Burna Sis, LCSW Clinical Social Worker [phone]

## 2017-02-24 DIAGNOSIS — F17210 Nicotine dependence, cigarettes, uncomplicated: Secondary | ICD-10-CM

## 2017-02-24 DIAGNOSIS — G4700 Insomnia, unspecified: Secondary | ICD-10-CM

## 2017-02-24 DIAGNOSIS — Z630 Problems in relationship with spouse or partner: Secondary | ICD-10-CM

## 2017-02-24 DIAGNOSIS — R450 Nervousness: Secondary | ICD-10-CM

## 2017-02-24 DIAGNOSIS — F314 Bipolar disorder, current episode depressed, severe, without psychotic features: Secondary | ICD-10-CM

## 2017-02-24 DIAGNOSIS — R4584 Anhedonia: Secondary | ICD-10-CM

## 2017-02-24 DIAGNOSIS — F410 Panic disorder [episodic paroxysmal anxiety] without agoraphobia: Secondary | ICD-10-CM

## 2017-02-24 DIAGNOSIS — F1910 Other psychoactive substance abuse, uncomplicated: Secondary | ICD-10-CM

## 2017-02-24 DIAGNOSIS — F419 Anxiety disorder, unspecified: Secondary | ICD-10-CM

## 2017-02-24 DIAGNOSIS — R454 Irritability and anger: Secondary | ICD-10-CM

## 2017-02-24 DIAGNOSIS — F603 Borderline personality disorder: Secondary | ICD-10-CM

## 2017-02-24 DIAGNOSIS — Z653 Problems related to other legal circumstances: Secondary | ICD-10-CM

## 2017-02-24 MED ORDER — IBUPROFEN 600 MG PO TABS
600.0000 mg | ORAL_TABLET | Freq: Four times a day (QID) | ORAL | Status: DC | PRN
  Administered 2017-02-25: 600 mg via ORAL
  Filled 2017-02-24: qty 1

## 2017-02-24 MED ORDER — ARIPIPRAZOLE 5 MG PO TABS
5.0000 mg | ORAL_TABLET | Freq: Every day | ORAL | Status: DC
  Administered 2017-02-24 – 2017-02-25 (×2): 5 mg via ORAL
  Filled 2017-02-24 (×4): qty 1
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
## 2017-02-24 NOTE — Progress Notes (Signed)
EKG completed and given to MD for review.  

## 2017-02-24 NOTE — Progress Notes (Signed)
Recreation Therapy Notes  Date:  02/24/17  Time: 0930 Location: [address] Dayroom  Group Topic: Stress Management  Goal Area(s) Addresses:  Patient will verbalize importance of using healthy stress management.  Patient will identify positive emotions associated with healthy stress management.   Intervention: Stress Management  Activity :  Progressive Muscle Relaxation.  LRT introduced the stress management technique of progressive muscle relaxation.  LRT read a script to allow patients to focus on each muscle group individually to tense and then relax the muscles.  Patients were to follow along as the script was read to engage in the activity.  Education:  Stress Management, Discharge Planning.   Education Outcome: Acknowledges edcuation/In group clarification offered/Needs additional education  Clinical Observations/Feedback: Pt did not attend group.   Caroll Rancher, LRT/CTRS         Caroll Rancher A 02/24/2017 11:41 AM

## 2017-02-24 NOTE — Progress Notes (Signed)
DAR NOTE: Patient presents with anxious affect and depressed mood.  Pt isolates a lot in the room. Pt stated she met her ex girlfriend here, currently she is having court issues with the ex girl friend and so being out in the dayroom bring some memories.. Pt also stated that she feels like no body listen to her, no body understands, groups and therapies are not working for her and therefore does not see the point of being held are even going to the hospital to begin with. Pt stated she will attend the groups just for her to get out of here. Reported good night sleep, poor appetite, low energy, and poor concentration. Denies pain, auditory and visual hallucinations.  Rates depression at 6, hopelessness at 0, and anxiety at 2.  Maintained on routine safety checks.  Medications given as prescribed.  Support and encouragement offered as needed. States goal for today is "talking to my doctor and social worker."  Patient observed socializing with peers in the dayroom.  Offered no complaint.
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
## 2017-02-24 NOTE — Progress Notes (Signed)
Kerri Parks was in room and in bed for the duration of the evening. He chose not to attend or participate in group activity. He did not verbalize any complaints of pain this evening and did not request or receive any medications. A. Support and encouragement provided. R. Safety maintained, will continue to monitor.

## 2017-02-24 NOTE — [facility] Group Notes (Signed)
LCSW Group Therapy Note   02/24/2017 1:15pm   Type of Therapy and Topic:  Group Therapy:  Overcoming Obstacles   Participation Level:  Active   Description of Group:    In this group patients will be encouraged to explore what they see as obstacles to their own wellness and recovery. They will be guided to discuss their thoughts, feelings, and behaviors related to these obstacles. The group will process together ways to cope with barriers, with attention given to specific choices patients can make. Each patient will be challenged to identify changes they are motivated to make in order to overcome their obstacles. This group will be process-oriented, with patients participating in exploration of their own experiences as well as giving and receiving support and challenge from other group members.   Therapeutic Goals: 1. Patient will identify personal and current obstacles as they relate to admission. 2. Patient will identify barriers that currently interfere with their wellness or overcoming obstacles.  3. Patient will identify feelings, thought process and behaviors related to these barriers. 4. Patient will identify two changes they are willing to make to overcome these obstacles:      Summary of Patient Progress Pt participated appropriately in discussion and identified the need to have a positive outlook when facing obstacles. He was able to identify helpful skills for planning and problem-solving when facing obstacles such as asking someone with experience in that particular area.      Therapeutic Modalities:   Cognitive Behavioral Therapy Solution Focused Therapy Motivational Interviewing Relapse Prevention Therapy  Verdene Lennert, LCSW 02/24/2017 4:32 PM

## 2017-02-24 NOTE — [facility] Suicide Risk Assessment (Signed)
[facility] INPATIENT:  Family/Significant Other Suicide Prevention Education  Suicide Prevention Education:  Patient Refusal for Family/Significant Other Suicide Prevention Education: The patient Kerri Parks has refused to provide written consent for family/significant other to be provided Family/Significant Other Suicide Prevention Education during admission and/or prior to discharge.  Physician notified.  Verdene Lennert 02/24/2017, 4:29 PM

## 2017-02-24 NOTE — Social Work (Signed)
Referred to [facility], is [place] resident.  Anne Cunningham, LCSW Lead Clinical Social Worker Phone:  [phone]  

## 2017-02-25 DIAGNOSIS — T50902A Poisoning by unspecified drugs, medicaments and biological substances, intentional self-harm, initial encounter: Secondary | ICD-10-CM

## 2017-02-25 DIAGNOSIS — F39 Unspecified mood [affective] disorder: Secondary | ICD-10-CM

## 2017-02-25 DIAGNOSIS — F1410 Cocaine abuse, uncomplicated: Secondary | ICD-10-CM

## 2017-02-25 DIAGNOSIS — F1210 Cannabis abuse, uncomplicated: Secondary | ICD-10-CM

## 2017-02-25 DIAGNOSIS — T1491XA Suicide attempt, initial encounter: Secondary | ICD-10-CM

## 2017-02-25 LAB — HEMOGLOBIN A1C
HEMOGLOBIN A1C: 5.2 % (ref 4.8–5.6)
Mean Plasma Glucose: 102.54 mg/dL

## 2017-02-25 LAB — HEPATITIS PANEL, ACUTE
HEP A IGM: NEGATIVE
Hep B C IgM: NEGATIVE
Hepatitis B Surface Ag: NEGATIVE

## 2017-02-25 LAB — LIPID PANEL
Cholesterol: 166 mg/dL (ref 0–200)
HDL: 50 mg/dL (ref >40)
LDL CALC: 96 mg/dL (ref 0–99)
TRIGLYCERIDES: 100 mg/dL (ref <150)
Total CHOL/HDL Ratio: 3.3 RATIO
VLDL: 20 mg/dL (ref 0–40)

## 2017-02-25 MED ORDER — ARIPIPRAZOLE 10 MG PO TABS
10.0000 mg | ORAL_TABLET | Freq: Every day | ORAL | Status: DC
  Administered 2017-02-26: 10 mg via ORAL
  Filled 2017-02-25: qty 1
  Filled 2017-02-25: qty 14
  Filled 2017-02-25 (×2): qty 1

## 2017-02-25 MED ORDER — ARIPIPRAZOLE 5 MG PO TABS
5.0000 mg | ORAL_TABLET | Freq: Once | ORAL | Status: AC
  Administered 2017-02-25: 5 mg via ORAL
  Filled 2017-02-25: qty 1

## 2017-02-25 NOTE — Progress Notes (Signed)
[facility] MD Progress Note  02/25/2017 1:29 PM Kerri Parks  MRN:  [id number]   Subjective:  AKA Kerri Parks. Patient reports doing better today. He reports that he has no anxiety, and depression is at 3/10. He denies any SI/HI/AVH today. He states "I went to the day room and talked to people like you asked and it was better than staying in my room." Patient is requesting long acting injectable due to his numerous attempts at overdosing on his prescribed medications.  Objective: Patient's chart and findings reviewed and discussed with treatment team. Patient is pleasant and cooperative today. He is still pushing for an long acting injectable and due to having an ACT team this is Parks good chance to keep patient stable and remove excessive medications for possible overdoses in the future. Will increase the Abilify to 10 mg Daily and if no side effects or abnormal symptoms will administer Abilify Maintena injection tomorrow. Will have CSW arrange meeting with ACT team before discharge.   Principal Problem: Bipolar disorder (HCC) Diagnosis:   Patient Active Problem List   Diagnosis Date Noted  . Bipolar disorder (HCC) [F31.9] 02/23/2017  . Borderline personality disorder (HCC) [F60.3] 02/22/2017  . Intentional overdose of drug in tablet form (HCC) [T50.902A] 02/21/2017  . Suicidal ideation [R45.851]   . Bipolar affective disorder, depressed, mild (HCC) [F31.31] 03/09/2016  . Overdose [T50.901A] 03/07/2016  . Encounter for central line placement [Z45.2]   . Lithium toxicity [T56.891A]   . Prolonged QT interval [R94.31]   . Suicide attempt (HCC) [T14.91XA]   . Food allergy [Z91.018] 11/08/2015  . Foster child [facility] 11/08/2015  . Iron deficiency anemia [D50.9] 11/08/2015  . Asthma, chronic [J45.909] 08/19/2013   Total Time spent with patient: 15 minutes  Past Psychiatric History: See H&P  Past Medical History:  Past Medical History:  Diagnosis Date  . Anemia   . Anxiety   . Asthma   .  Bipolar 1 disorder (HCC)   . Conduct disorder   . Depression   . Hypothyroidism 08/05/2013   Per pt report  . Oppositional defiant behavior   . Overdose 03/07/2016   intential     Past Surgical History:  Procedure Laterality Date  . UPPER GI ENDOSCOPY     Family History:  Family History  Problem Relation Age of Onset  . Family history unknown: Yes   Family Psychiatric  History: See H&P Social History:  History  Alcohol Use  . 3.0 oz/week  . 5 Cans of beer per week    Comment: in the past     History  Drug Use  . Types: Marijuana, Cocaine    Comment: "whenever she can get it"    Social History   Social History  . Marital status: Single    Spouse name: N/Parks  . Number of children: N/Parks  . Years of education: N/Parks   Social History Main Topics  . Smoking status: Current Every Day Smoker    Packs/day: 0.25    Years: 2.00    Types: Cigarettes  . Smokeless tobacco: Never Used  . Alcohol use 3.0 oz/week    5 Cans of beer per week     Comment: in the past  . Drug use: Yes    Types: Marijuana, Cocaine     Comment: "whenever she can get it"  . Sexual activity: Not Currently   Other Topics Concern  . None   Social History Narrative  . None   Additional Social History:  Pain Medications: none Prescriptions: lamictal Over the Counter: none History of alcohol / drug use?: Yes Longest period of sobriety (when/how long): few months Negative Consequences of Use: Financial, Legal, Personal relationships, Work / School                    Sleep: Good  Appetite:  Good  Current Medications: Current Facility-Administered Medications  Medication Dose Route Frequency Provider Last Rate Last Dose  . acetaminophen (TYLENOL) tablet 650 mg  650 mg Oral Q6H PRN Kerri Parks, Kerri A, NP   650 mg at 02/24/17 2029  . alum & mag hydroxide-simeth (MAALOX/MYLANTA) 200-200-20 MG/5ML suspension 30 mL  30 mL Oral Q4H PRN Kerri Parks, Kerri A, NP      . [START ON 02/26/2017]  ARIPiprazole (ABILIFY) tablet 10 mg  10 mg Oral Daily Kerri Parks, Kerri B, FNP      . ibuprofen (ADVIL,MOTRIN) tablet 600 mg  600 mg Oral Q6H PRN Kerri Sievert E, PA-C   600 mg at 02/25/17 0804  . magnesium hydroxide (MILK OF MAGNESIA) suspension 30 mL  30 mL Oral Daily PRN Kerri Parks, Kerri A, NP      . nicotine polacrilex (NICORETTE) gum 2 mg  2 mg Oral PRN Kerri Parks, Kerri Situ, MD        Lab Results:  Results for orders placed or performed during the hospital encounter of 02/23/17 (from the past 48 hour(s))  Hemoglobin A1c     Status: None   Collection Time: 02/25/17  6:32 AM  Result Value Ref Range   Hgb A1c MFr Bld 5.2 4.8 - 5.6 %    Comment: (NOTE) Pre diabetes:          5.7%-6.4% Diabetes:              >6.4% Glycemic control for   <7.0% adults with diabetes    Mean Plasma Glucose 102.54 mg/dL    Comment: Performed at [facility], [address] [place], [place] [address]  Lipid panel     Status: None   Collection Time: 02/25/17  6:32 AM  Result Value Ref Range   Cholesterol 166 0 - 200 mg/dL   Triglycerides 604 <540 mg/dL   HDL 50 >98 mg/dL   Total CHOL/HDL Ratio 3.3 RATIO   VLDL 20 0 - 40 mg/dL   LDL Cholesterol 96 0 - 99 mg/dL    Comment:        Total Cholesterol/HDL:CHD Risk Coronary Heart Disease Risk Table                     Men   Women  1/2 Average Risk   3.4   3.3  Average Risk       5.0   4.4  2 X Average Risk   9.6   7.1  3 X Average Risk  23.4   11.0        Use the calculated Patient Ratio above and the CHD Risk Table to determine the patient's CHD Risk.        ATP III CLASSIFICATION (LDL):  <100     mg/dL   Optimal  119-147  mg/dL   Near or Above                    Optimal  130-159  mg/dL   Borderline  829-562  mg/dL   High  >130     mg/dL   Very High Performed at [facility], [address] [place], [place] [address]     Blood Alcohol level:  Lab Results  Component Value Date   [facility] <5 07/24/2016   ETH <5 05/26/2016    Metabolic  Disorder Labs: Lab Results  Component Value Date   HGBA1C 5.2 02/25/2017   MPG 102.54 02/25/2017   MPG 108 05/31/2016   Lab Results  Component Value Date   PROLACTIN 20.5 05/31/2016   PROLACTIN 29.1 (H) 03/28/2016   Lab Results  Component Value Date   CHOL 166 02/25/2017   TRIG 100 02/25/2017   HDL 50 02/25/2017   CHOLHDL 3.3 02/25/2017   VLDL 20 02/25/2017   LDLCALC 96 02/25/2017   LDLCALC 66 05/31/2016    Physical Findings: AIMS: Facial and Oral Movements Muscles of Facial Expression: None, normal Lips and Perioral Area: None, normal Jaw: None, normal Tongue: None, normal,Extremity Movements Upper (arms, wrists, hands, fingers): None, normal Lower (legs, knees, ankles, toes): None, normal, Trunk Movements Neck, shoulders, hips: None, normal, Overall Severity Severity of abnormal movements (highest score from questions above): None, normal Incapacitation due to abnormal movements: None, normal Patient's awareness of abnormal movements (rate only patient's report): No Awareness, Dental Status Current problems with teeth and/or dentures?: No Does patient usually wear dentures?: No  CIWA:  CIWA-Ar Total: 1 COWS:  COWS Total Score: 1  Musculoskeletal: Strength & Muscle Tone: within normal limits Gait & Station: normal Patient leans: N/Parks  Psychiatric Specialty Exam: Physical Exam  Nursing note and vitals reviewed. Constitutional: She is oriented to person, place, and time. She appears well-developed and well-nourished.  Cardiovascular: Normal rate.   Respiratory: Effort normal.  Musculoskeletal: Normal range of motion.  Neurological: She is alert and oriented to person, place, and time.  Skin: Skin is warm.    Review of Systems  Constitutional: Negative.   HENT: Negative.   Eyes: Negative.   Respiratory: Negative.   Cardiovascular: Negative.   Gastrointestinal: Negative.   Genitourinary: Negative.   Musculoskeletal: Negative.   Skin: Negative.    Neurological: Negative.   Endo/Heme/Allergies: Negative.   Psychiatric/Behavioral: Positive for depression. Negative for hallucinations and suicidal ideas. The patient is not nervous/anxious.     Blood pressure 118/76, pulse 92, temperature 98.9 F (37.2 C), temperature source Oral, resp. rate 16, height 5' 3.78" (1.62 m), weight 58.5 kg (129 lb), last menstrual period 02/03/2017, SpO2 100 %.Body mass index is 22.3 kg/m.  General Appearance: Casual  Eye Contact:  Fair  Speech:  Clear and Coherent and Normal Rate  Volume:  Normal  Mood:  Depressed  Affect:  Flat  Thought Process:  Goal Directed and Descriptions of Associations: Intact  Orientation:  Full (Time, Place, and Person)  Thought Content:  WDL  Suicidal Thoughts:  No  Homicidal Thoughts:  No  Memory:  Immediate;   Good Recent;   Good Remote;   Good  Judgement:  Fair  Insight:  Good  Psychomotor Activity:  Normal  Concentration:  Concentration: Good and Attention Span: Good  Recall:  Good  Fund of Knowledge:  Good  Language:  Good  Akathisia:  No  Handed:  Right  AIMS (if indicated):     Assets:  Communication Skills Desire for Improvement Financial Resources/Insurance Housing Physical Health Social Support Transportation  ADL's:  Intact  Cognition:  WNL  Sleep:  Number of Hours: 5.25   Problem Addressed: Bipolar Disorder  Treatment Plan Summary: Daily contact with patient to assess and evaluate symptoms and progress in treatment, Medication management and Plan is  to:  -Increase Abilify 10 mg PO Daily for mood stability -Start Abilify Maintena 400 mg IM Q28 days injection tomorrow   Maryfrances Bunnell, FNP 02/25/2017, 1:29 PM   Agree with NP Progress Note

## 2017-02-25 NOTE — Progress Notes (Signed)
Adult Psychoeducational Group Note  Date:  02/25/2017 Time:  1:00 AM  Group Topic/Focus:  Wrap-Up Group:   The focus of this group is to help patients review their daily goal of treatment and discuss progress on daily workbooks.  Participation Level:  Active  Participation Quality:  Appropriate  Affect:  Appropriate  Cognitive:  Appropriate  Insight: Appropriate  Engagement in Group:  Engaged  Modes of Intervention:  Discussion  Additional Comments:  Pt stated her goal was to talk with her social worker and therapist. Pt stated she accomplished her goal. Pt rated her overall day a 6 out of 10.  Pt stated she attended all groups held today.  Felipa Furnace 02/25/2017, 1:00 AM

## 2017-02-25 NOTE — Progress Notes (Signed)
D- Report received from [place], [place]. On start of writer's shift, patient is observed resting quietly in bed, respirations even and unlabored, color satisfactory. Per report, patient currently denies SI, HI, and AVH.  Per report, "patient has been in bed for most of the day but has spent more time out of bed today than she had yesterday". Per report, patient had complaints of a "stiff neck". Pain medications were given per MD order and evaluated which showed decreased pain.  No other complaints noted.   A- Routine safety checks conducted every 15 minutes.   R- Patient remains safe at this time.

## 2017-02-25 NOTE — Plan of Care (Signed)
Problem: Medication: Goal: Compliance with prescribed medication regimen will improve Outcome: Progressing Patient took her medication this morning as scheduled.  Patient also complained of pain at the back of her neck, and stated pain was 6/10 this morning.  Pt was medicated with Ibuprofen  for pain.  Problem: Self-Concept: Goal: Ability to disclose and discuss suicidal ideas will improve Outcome: Progressing Patient denies any current SI/HI/AVH, Q15 minute safety checks are in place.    Comments: Patient is calm and cooperative this morning, appears blunted and depressed, states that her main stressors are relationship issues with her girlfriends, but adds that she is working on focusing on herself.  Patient encouraged to bring all concerns to staff's attention and verbalizes understanding.  Pt has limited interactions with her peers, and was encouraged this morning to participate in group activities and verbalizes understanding.  Pt also encouraged to complete her self inventory form and bring it to staff.  Will continue to monitor.

## 2017-02-25 NOTE — [facility] Group Notes (Signed)
LCSW Group Therapy Note  02/25/2017 1:15pm  Type of Therapy/Topic:  Group Therapy:  Feelings about Diagnosis  Participation Level:  Active   Description of Group:   This group will allow patients to explore their thoughts and feelings about diagnoses they have received. Patients will be guided to explore their level of understanding and acceptance of these diagnoses. Facilitator will encourage patients to process their thoughts and feelings about the reactions of others to their diagnosis and will guide patients in identifying ways to discuss their diagnosis with significant others in their lives. This group will be process-oriented, with patients participating in exploration of their own experiences, giving and receiving support, and processing challenge from other group members.   Therapeutic Goals: 1. Patient will demonstrate understanding of diagnosis as evidenced by identifying two or more symptoms of the disorder 2. Patient will be able to express two feelings regarding the diagnosis 3. Patient will demonstrate their ability to communicate their needs through discussion and/or role play  Summary of Patient Progress: Pt discussed the stigma experienced in society, including being called "crazy" and being assumed to be less capable. Pt discussed how he feels that he is caring and puts others before himself.    Therapeutic Modalities:   Cognitive Behavioral Therapy Brief Therapy Feelings Identification    Verdene Lennert, LCSW 02/25/2017 2:45 PM

## 2017-02-25 NOTE — Progress Notes (Signed)
Recreation Therapy Notes  Animal-Assisted Activity (AAA) Program Checklist/Progress Notes Patient Eligibility Criteria Checklist & Daily Group note for Rec TxIntervention  Date: 10.16.2018 Time: 2:45pm Location: [address] Dayroom   AAA/T Program Assumption of Risk Form signed by Patient/ or Parent Legal Guardian Yes  Patient is free of allergies or sever asthma Yes  Patient reports no fear of animals Yes  Patient reports no history of cruelty to animals Yes  Patient understands his/her participation is voluntary Yes  Patient washes hands before animal contact Yes  Patient washes hands after animal contact Yes  Behavioral Response: Appropriate   Education:Hand Washing, Appropriate Animal Interaction   Education Outcome: Acknowledges education.   Clinical Observations/Feedback: Patient attended session and interacted appropriately with therapy dog and peers.   Courtlyn Aki L Angelea Penny, LRT/CTRS        Ruari Duggan L 02/25/2017 3:02 PM 

## 2017-02-26 LAB — PROLACTIN: PROLACTIN: 31.2 ng/mL — AB (ref 4.8–23.3)

## 2017-02-26 MED ORDER — ARIPIPRAZOLE ER 400 MG IM SRER: 400.0000 mg | INTRAMUSCULAR | 0 refills | Status: DC

## 2017-02-26 MED ORDER — ARIPIPRAZOLE ER 400 MG IM SRER
400.0000 mg | INTRAMUSCULAR | Status: DC
  Administered 2017-02-26: 400 mg via INTRAMUSCULAR

## 2017-02-26 MED ORDER — ARIPIPRAZOLE 10 MG PO TABS: 10.0000 mg | ORAL_TABLET | Freq: Every day | ORAL | 0 refills | Status: DC

## 2017-02-26 NOTE — Progress Notes (Signed)
D- Patient alert and oriented.  Patient is flat with a blunted affect.  Patient currently denies SI, HI, AVH, and pain.  Patient has been participating and attending groups. Patient frequently verbalizes desire for discharge.  Patient verbalizes "hospitals don't help me. All I need is love".  Patient rates his day "5" and states that he had an "ok" day.  Patient reports that he had been coping with drugs prior to admission.  Patient verbalizes plans for the future stating "i am working on my GED and have plans to go to college". Patient verbalizes strained relationship with his parents;  No further issues.   A- Support and encouragement provided.  Routine safety checks conducted every 15 minutes.  Patient informed to notify staff with problems or concerns. R- Patient contracts for safety at this time. Patient receptive, calm, and cooperative. Patient interacts well with others on the unit.  Patient remains safe at this time.

## 2017-02-26 NOTE — [facility] Suicide Risk Assessment (Signed)
[facility]   Principal Problem: Bipolar disorder [facility](HCC) Discharge Diagnoses:  Patient Active Problem List   Diagnosis Date Noted  . Bipolar disorder (HCC) [F31.9] 02/23/2017  . Borderline personality disorder (HCC) [F60.3] 02/22/2017  . Intentional overdose of drug in tablet form (HCC) [T50.902A] 02/21/2017  . Suicidal ideation [R45.851]   . Bipolar affective disorder, depressed, mild (HCC) [F31.31] 03/09/2016  . Overdose [T50.901A] 03/07/2016  . Encounter for central line placement [Z45.2]   . Lithium toxicity [T56.891A]   . Prolonged QT interval [R94.31]   . Suicide attempt (HCC) [T14.91XA]   . Food allergy [Z91.018] 11/08/2015  . Foster child [facility][Z62.21] 11/08/2015  . Iron deficiency anemia [D50.9] 11/08/2015  . Asthma, chronic [J45.909] 08/19/2013    Total Time spent with patient: 30 minutes  Musculoskeletal: Strength & Muscle Tone: within normal limits Gait & Station: normal Patient leans: N/A  Psychiatric Specialty Exam: ROS no headache, no chest pain, no shortness of breath, no vomiting  Blood pressure 126/86, pulse (!) 106, temperature 98.5 F (36.9 C), temperature source Oral, resp. rate 16, height 5' 3.78" (1.62 m), weight 58.5 kg (129 lb), last menstrual period 02/03/2017, SpO2 100 %.Body mass index is 22.3 kg/m.  General Appearance: Neat  Eye Contact::  Good  Speech:  Normal Rate409  Volume:  Normal  Mood:  improved, states " I feel better "  Affect:  more reactive, less irritable   Thought Process:  Linear and Descriptions of Associations: Intact  Orientation:  Full (Time, Place, and Person)  Thought Content:  no hallucinations, no delusions  Suicidal Thoughts:  No denies self injurious or suicidal ideations  Homicidal Thoughts:  No  Memory:  recent and remote grossly intact   Judgement:  Other:  improved   Insight:  fair- improving   Psychomotor Activity:  Normal  Concentration:  Good  Recall:  Good  Fund of Knowledge:Good   Language: Good  Akathisia:  Negative  Handed:  Right  AIMS (if indicated):   no abnormal movements noted or reported   Assets:  Communication Skills Desire for Improvement Resilience  Sleep:  Number of Hours: 5.25  Cognition: WNL  ADL's:  Intact   Mental Status Per Nursing Assessment::   On Admission:  Suicidal ideation indicated by patient, Self-harm thoughts, Self-harm behaviors  Demographic Factors:  21 year old , identifies self as transgender female to female, single , lives in boarding house   Loss Factors: Recent break up, relationship stressors  Historical Factors: History of Bipolar Disorder diagnosis in the past, history of Borderline Personality Disorder features, history of prior admissions  Risk Reduction Factors:   Positive coping skills or problem solving skills  Continued Clinical Symptoms:  At this time patient is alert, attentive, reports improving mood and affect is more reactive, smiles at times appropriately. No thought disorder, no suicidal or self injurious ideations, no homicidal or violent ideations, no psychotic symptoms. Future oriented . Patient has tolerated PO Abilify well , no side effects, no akathisia, and at request was started on Abilify SRER IM, receiving first dose today 10/17 prior to discharge. Tolerated injection well . Rationale for long acting medication discussed- patient states prefers to be on long acting in order to improve compliance with meds .  Cognitive Features That Contribute To Risk:  No gross cognitive deficits noted upon discharge. Is alert , attentive, and oriented x 3   Suicide Risk:  Moderate:  Frequent suicidal ideation with limited intensity, and duration, some specificity in terms of  plans, no associated intent, good self-control, limited dysphoria/symptomatology, some risk factors present, and identifiable protective factors, including available and accessible social support.  Follow-up Information    [facility] Follow up.   Why:  Please continue your services with your ACT Team at discharge; they will provide transportation home from the hospital. Contact information: [address] [place] [address] [phone]           Plan Of Care/Follow-up recommendations:  Activity:  as tolerated  Diet:  regular Tests:  NA Other:  see below  Patient is requesting discharge, expressing readiness for discharge and there are no current grounds for involuntary commitment Patient to follow up as above   Craige Cotta, MD 02/26/2017, 3:07 PM

## 2017-02-26 NOTE — Tx Team (Signed)
Interdisciplinary Treatment and Diagnostic Plan Update  02/26/2017 Time of Session: 9:30am Kerri LenisMercedes R Parks MRN: [id number]  Principal Diagnosis: Bipolar disorder [facility](HCC)  Secondary Diagnoses: Principal Problem:   Bipolar disorder (HCC) Active Problems:   Borderline personality disorder (HCC)   Current Medications:  Current Facility-Administered Medications  Medication Dose Route Frequency Provider Last Rate Last Dose  . acetaminophen (TYLENOL) tablet 650 mg  650 mg Oral Q6H PRN Okonkwo, Justina A, NP   650 mg at 02/24/17 2029  . alum & mag hydroxide-simeth (MAALOX/MYLANTA) 200-200-20 MG/5ML suspension 30 mL  30 mL Oral Q4H PRN Okonkwo, Justina A, NP      . ARIPiprazole (ABILIFY) tablet 10 mg  10 mg Oral Daily Money, Gerlene Burdockravis B, FNP   10 mg at 02/26/17 0835  . ibuprofen (ADVIL,MOTRIN) tablet 600 mg  600 mg Oral Q6H PRN Kerry HoughSimon, Spencer E, PA-C   600 mg at 02/25/17 0804  . magnesium hydroxide (MILK OF MAGNESIA) suspension 30 mL  30 mL Oral Daily PRN Okonkwo, Justina A, NP      . nicotine polacrilex (NICORETTE) gum 2 mg  2 mg Oral PRN Cobos, Rockey SituFernando A, MD        PTA Medications: Prescriptions Prior to Admission  Medication Sig Dispense Refill Last Dose  . lamoTRIgine (LAMICTAL) 25 MG tablet Take 25 mg by mouth daily.   02/21/2017 at Unknown time    Treatment Modalities: Medication Management, Group therapy, Case management,  1 to 1 session with clinician, Psychoeducation, Recreational therapy.  Patient Stressors: Civil Service fast streamerducational concerns Financial difficulties Health problems Legal issue Marital or family conflict Medication change or noncompliance Occupational concerns Substance abuse Traumatic event  Patient Strengths: Average or above average intelligence Capable of independent living Barrister's clerkCommunication skills Motivation for treatment/growth Supportive family/friends  Physician Treatment Plan for Primary Diagnosis: Bipolar disorder (HCC) Long Term Goal(s): Improvement in  symptoms so as ready for discharge  Short Term Goals: Ability to verbalize feelings will improve Ability to disclose and discuss suicidal ideas Ability to demonstrate self-control will improve Ability to identify and develop effective coping behaviors will improve Ability to maintain clinical measurements within normal limits will improve Compliance with prescribed medications will improve  Medication Management: Evaluate patient's response, side effects, and tolerance of medication regimen.  Therapeutic Interventions: 1 to 1 sessions, Unit Group sessions and Medication administration.  Evaluation of Outcomes: Progressing  Physician Treatment Plan for Secondary Diagnosis: Principal Problem:   Bipolar disorder (HCC) Active Problems:   Borderline personality disorder (HCC)   Long Term Goal(s): Improvement in symptoms so as ready for discharge  Short Term Goals: Ability to verbalize feelings will improve Ability to disclose and discuss suicidal ideas Ability to demonstrate self-control will improve Ability to identify and develop effective coping behaviors will improve Ability to maintain clinical measurements within normal limits will improve Compliance with prescribed medications will improve  Medication Management: Evaluate patient's response, side effects, and tolerance of medication regimen.  Therapeutic Interventions: 1 to 1 sessions, Unit Group sessions and Medication administration.  Evaluation of Outcomes: Progressing   RN Treatment Plan for Primary Diagnosis: Bipolar disorder (HCC) Long Term Goal(s): Knowledge of disease and therapeutic regimen to maintain health will improve  Short Term Goals: Ability to disclose and discuss suicidal ideas and Ability to identify and develop effective coping behaviors will improve  Medication Management: RN will administer medications as ordered by provider, will assess and evaluate patient's response and provide education to patient  for prescribed medication. RN will report any adverse and/or side effects to  prescribing provider.  Therapeutic Interventions: 1 on 1 counseling sessions, Psychoeducation, Medication administration, Evaluate responses to treatment, Monitor vital signs and CBGs as ordered, Perform/monitor CIWA, COWS, AIMS and Fall Risk screenings as ordered, Perform wound care treatments as ordered.  Evaluation of Outcomes: Progressing   LCSW Treatment Plan for Primary Diagnosis: Bipolar disorder (HCC) Long Term Goal(s): Safe transition to appropriate next level of care at discharge, Engage patient in therapeutic group addressing interpersonal concerns.  Short Term Goals: Engage patient in aftercare planning with referrals and resources and Increase skills for wellness and recovery  Therapeutic Interventions: Assess for all discharge needs, 1 to 1 time with Social worker, Explore available resources and support systems, Assess for adequacy in community support network, Educate family and significant other(s) on suicide prevention, Complete Psychosocial Assessment, Interpersonal group therapy.  Evaluation of Outcomes: Progressing   Progress in Treatment: Attending groups: Yes  Participating in groups: Yes Taking medication as prescribed: Yes, MD continues to assess for medication changes as needed Toleration medication: Yes, no side effects reported at this time Family/Significant other contact made: No, Pt declines Patient understands diagnosis: Continuing to assess Discussing patient identified problems/goals with staff: Yes Medical problems stabilized or resolved: Yes Denies suicidal/homicidal ideation: Yes Issues/concerns per patient self-inventory: None Other: N/A  New problem(s) identified: None identified at this time.   New Short Term/Long Term Goal(s): "get discharged and get on an injectable medication"  Discharge Plan or Barriers: Pt will return home and follow-up with [facility]  ACTT services  Reason for Continuation of Hospitalization: Anxiety Depression Medication stabilization  Estimated Length of Stay: 1-2 days; est DC date 10/18  Attendees: Patient:  02/26/2017  8:48 AM  Physician: Dr. Jama Flavors, MD 02/26/2017  8:48 AM  Nursing: Cicero Duck, RN 02/26/2017  8:48 AM  RN Care Manager: Onnie Boer, RN 02/26/2017  8:48 AM  Social Worker: Vernie Shanks, LCSW 02/26/2017  8:48 AM  Recreational Therapist:  02/26/2017  8:48 AM  Other: Armandina Stammer, NP; Reola Calkins, NP 02/26/2017  8:48 AM  Other:  02/26/2017  8:48 AM  Other: 02/26/2017  8:48 AM    Scribe for Treatment Team: Verdene Lennert, LCSW 02/26/2017 8:48 AM

## 2017-02-26 NOTE — Progress Notes (Signed)
  [facility] Discharge Plan :  Will you be returning to the same living situation after discharge:  Yes,  Pt returning home At discharge, do you have transportation home?: Yes,  Pt ACTT to pick up Do you have the ability to pay for your medications: Yes,  pt provided with samples  Release of information consent forms completed and in the chart;  Patient's signature needed at discharge.  Patient to Follow up at: Follow-up Information    [facility] Follow up.   Why:  Please continue your services with your ACT Team at discharge; they will provide transportation home from the hospital. Contact information: [address] [facility] [place] [phone]           Next level of care provider has access to [facility] Link:no  Safety Planning and Suicide Prevention discussed: Yes,  with Pt; declines family contact  Have you used any form of tobacco in the last 30 days? (Cigarettes, Smokeless Tobacco, Cigars, and/or Pipes): Yes  Has patient been referred to the Quitline?: Patient refused referral  Patient has been referred for addiction treatment: Yes  Verdene LennertLauren C Kinzey Sheriff, LCSW 02/26/2017, 2:32 PM

## 2017-02-26 NOTE — Progress Notes (Signed)
Recreation Therapy Notes  Date:  02/26/17  Time: 0930 Location: [address] Dayroom  Group Topic: Stress Management  Goal Area(s) Addresses:  Patient will verbalize importance of using healthy stress management.  Patient will identify positive emotions associated with healthy stress management.   Intervention: Stress Management  Activity :  Meditation.  LRT introduced the stress management technique of meditation.  LRT played a meditation from the Calm app to allow patients to focus on the forgiveness of others.  Patients followed along as LRT played meditation.  Education:  Stress Management, Discharge Planning.   Education Outcome: Acknowledges edcuation/In group clarification offered/Needs additional education  Clinical Observations/Feedback: Pt did not attend group.    Caroll RancherMarjette Nari Vannatter, LRT/CTRS         Caroll RancherLindsay, Yarelis Ambrosino A 02/26/2017 12:16 PM
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
## 2017-02-26 NOTE — Progress Notes (Signed)
Patient ID: Kerri LenisMercedes R Hazen, female   DOB: 08/30/1995, 21 y.o.   MRN: [id number]   Pt. Discharged per MD orders;  PT. Currently denies any HI/SI or AVH.  Pt. Was given education regarding follow up appointments and medications by RN.  Pt. Denies any questions or concerns about the medications.  No adverse effect reported post Abilify injection.  Pt. Was escorted to the search room to retrieve his  belongings by RN before being discharged to the hospital lobby.

## 2017-02-26 NOTE — Progress Notes (Signed)
Adult Psychoeducational Group Note  Date:  02/26/2017 Time:  4:34 AM  Group Topic/Focus:  Wrap-Up Group:   The focus of this group is to help patients review their daily goal of treatment and discuss progress on daily workbooks.  Participation Level:  Active  Participation Quality:  Appropriate  Affect:  Appropriate  Cognitive:  Appropriate  Insight: Appropriate  Engagement in Group:  Engaged  Modes of Intervention:  Discussion  Additional Comments:  Pt stated her goal for today was to attend and participated in every group. Pt stated she accomplished her goal today. Pt rated her overall day a 6 out of 10. Pt attended all groups held today.   Kerri FurnaceChristopher  [name] 02/26/2017, 4:34 AM

## 2017-03-24 ENCOUNTER — Ambulatory Visit (INDEPENDENT_AMBULATORY_CARE_PROVIDER_SITE_OTHER): Payer: Medicaid Other

## 2017-03-24 ENCOUNTER — Other Ambulatory Visit: Payer: Self-pay

## 2017-03-24 ENCOUNTER — Ambulatory Visit (HOSPITAL_COMMUNITY)
Admission: EM | Admit: 2017-03-24 | Discharge: 2017-03-24 | Disposition: A | Discharge status: Home / Self Care | Payer: Medicaid Other | Attending: Emergency Medicine | Admitting: Emergency Medicine

## 2017-03-24 ENCOUNTER — Encounter (HOSPITAL_COMMUNITY): Payer: Self-pay | Admitting: Emergency Medicine

## 2017-03-24 DIAGNOSIS — M545 Low back pain, unspecified: Secondary | ICD-10-CM

## 2017-03-24 DIAGNOSIS — S20222A Contusion of left back wall of thorax, initial encounter: Secondary | ICD-10-CM | POA: Diagnosis not present

## 2017-03-24 DIAGNOSIS — R0782 Intercostal pain: Secondary | ICD-10-CM

## 2017-03-24 MED ORDER — NAPROXEN 375 MG PO TABS
375.0000 mg | ORAL_TABLET | Freq: Two times a day (BID) | ORAL | 0 refills | Status: DC
Start: 2017-03-24 — End: 2017-06-04
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
## 2017-03-24 NOTE — Discharge Instructions (Signed)
Ice to the left lower back off and on for the next 2-3 days. Perform gentle stretches to the left back muscle as demonstrated. Take the anti-inflammatory medicine as directed. Your spine examination was normal. If you develop new symptoms or problems or worsening may return or follow-up your primary care provider.

## 2017-06-04 ENCOUNTER — Encounter (HOSPITAL_COMMUNITY): Payer: Self-pay | Admitting: *Deleted

## 2017-06-04 ENCOUNTER — Inpatient Hospital Stay (HOSPITAL_COMMUNITY)
Admission: AD | Admit: 2017-06-04 | Discharge: 2017-06-04 | Disposition: A | Discharge status: Home / Self Care | Payer: Medicaid Other | Source: Ambulatory Visit | Attending: Obstetrics and Gynecology | Admitting: Obstetrics and Gynecology

## 2017-06-04 ENCOUNTER — Other Ambulatory Visit: Payer: Self-pay

## 2017-06-04 DIAGNOSIS — N939 Abnormal uterine and vaginal bleeding, unspecified: Secondary | ICD-10-CM

## 2017-06-04 DIAGNOSIS — J45909 Unspecified asthma, uncomplicated: Secondary | ICD-10-CM | POA: Insufficient documentation

## 2017-06-04 DIAGNOSIS — Z880 Allergy status to penicillin: Secondary | ICD-10-CM | POA: Diagnosis not present

## 2017-06-04 DIAGNOSIS — N941 Unspecified dyspareunia: Secondary | ICD-10-CM | POA: Diagnosis not present

## 2017-06-04 DIAGNOSIS — F319 Bipolar disorder, unspecified: Secondary | ICD-10-CM | POA: Diagnosis not present

## 2017-06-04 DIAGNOSIS — Z91010 Allergy to peanuts: Secondary | ICD-10-CM | POA: Diagnosis not present

## 2017-06-04 DIAGNOSIS — F17210 Nicotine dependence, cigarettes, uncomplicated: Secondary | ICD-10-CM | POA: Insufficient documentation

## 2017-06-04 DIAGNOSIS — F419 Anxiety disorder, unspecified: Secondary | ICD-10-CM | POA: Diagnosis not present

## 2017-06-04 DIAGNOSIS — R102 Pelvic and perineal pain: Secondary | ICD-10-CM | POA: Insufficient documentation

## 2017-06-04 DIAGNOSIS — Z79899 Other long term (current) drug therapy: Secondary | ICD-10-CM | POA: Insufficient documentation

## 2017-06-04 DIAGNOSIS — E039 Hypothyroidism, unspecified: Secondary | ICD-10-CM | POA: Diagnosis not present

## 2017-06-04 DIAGNOSIS — N926 Irregular menstruation, unspecified: Secondary | ICD-10-CM | POA: Insufficient documentation

## 2017-06-04 LAB — CBC
HEMATOCRIT: 41.2 % (ref 36.0–46.0)
HEMOGLOBIN: 13.7 g/dL (ref 12.0–15.0)
MCH: 28.7 pg (ref 26.0–34.0)
MCHC: 33.3 g/dL (ref 30.0–36.0)
MCV: 86.4 fL (ref 78.0–100.0)
Platelets: 248 10*3/uL (ref 150–400)
RBC: 4.77 MIL/uL (ref 3.87–5.11)
RDW: 13.2 % (ref 11.5–15.5)
WBC: 10.1 10*3/uL (ref 4.0–10.5)

## 2017-06-04 LAB — URINALYSIS, ROUTINE W REFLEX MICROSCOPIC
BILIRUBIN URINE: NEGATIVE
Glucose, UA: NEGATIVE mg/dL
Ketones, ur: NEGATIVE mg/dL
Nitrite: NEGATIVE
Protein, ur: 30 mg/dL — AB
SPECIFIC GRAVITY, URINE: 1.026 (ref 1.005–1.030)
pH: 6 (ref 5.0–8.0)

## 2017-06-04 LAB — WET PREP, GENITAL
Clue Cells Wet Prep HPF POC: NONE SEEN
SPERM: NONE SEEN
Trich, Wet Prep: NONE SEEN
WBC WET PREP: NONE SEEN
Yeast Wet Prep HPF POC: NONE SEEN

## 2017-06-04 LAB — POCT PREGNANCY, URINE: PREG TEST UR: NEGATIVE

## 2017-06-04 MED ORDER — NORGESTIMATE-ETH ESTRADIOL 0.25-35 MG-MCG PO TABS: 1.0000 | ORAL_TABLET | Freq: Every day | ORAL | 11 refills | Status: DC

## 2017-06-04 NOTE — MAU Note (Signed)
In Sept, spotted only, did not have a normal period. Started bleeding last night, had not had any bleeding since Sept. Neg UPT x2 at home. Started cramping in lower abd yesterday.  Doesn't know if she is pregnant or not, "just trying to be careful".

## 2017-06-04 NOTE — MAU Provider Note (Signed)
Chief Complaint: Abdominal Pain; Vaginal Bleeding; and Possible Pregnancy   First Provider Initiated Contact with Patient 06/04/17 1221      SUBJECTIVE HPI: Kerri Parks "Kerri Parks" is a 22 y.o. G0 transgender pt who identifies as female who presents to maternity admissions reporting heavy vaginal bleeding starting last night with irregular periods recently.  In September, the patient had light spotting only, then no period until yesterday when the bleeding was heavy, soaking a pad every 2-3 hours.  The bleeding is associated with cramping in the low abdomen.  The patient is sexually active with a female partner and is not using contraception so is concerned about pregnancy.  The only other symptom is occasional vaginal pain with intercourse, that started months ago but is worse in the last 3-4 months.  History is significant for inpatient behavioral health admission 02/2017 for suicide attempt with medications.  There are no other associated symptoms.  He has not tried any treatments.   HPI  Past Medical History:  Diagnosis Date  . Anemia   . Anxiety   . Asthma   . Bipolar 1 disorder (HCC)   . Conduct disorder   . Depression   . Hypothyroidism 08/05/2013   Per pt report  . Oppositional defiant behavior   . Overdose 03/07/2016   intential    Past Surgical History:  Procedure Laterality Date  . UPPER GI ENDOSCOPY     Social History   Socioeconomic History  . Marital status: Single    Spouse name: Not on file  . Number of children: Not on file  . Years of education: Not on file  . Highest education level: Not on file  Social Needs  . Financial resource strain: Not on file  . Food insecurity - worry: Not on file  . Food insecurity - inability: Not on file  . Transportation needs - medical: Not on file  . Transportation needs - non-medical: Not on file  Occupational History  . Not on file  Tobacco Use  . Smoking status: Current Every Day Smoker    Packs/day: 0.25    Years: 2.00     Pack years: 0.50    Types: Cigarettes  . Smokeless tobacco: Never Used  Substance and Sexual Activity  . Alcohol use: Yes    Alcohol/week: 3.0 oz    Types: 5 Cans of beer per week    Comment: in the past  . Drug use: Yes    Types: Marijuana, Cocaine    Comment: "whenever she can get it"  . Sexual activity: Not Currently  Other Topics Concern  . Not on file  Social History Narrative  . Not on file   No current facility-administered medications on file prior to encounter.    Current Outpatient Medications on File Prior to Encounter  Medication Sig Dispense Refill  . ARIPiprazole ER 400 MG SRER Inject 400 mg into the muscle every 28 (twenty-eight) days. For mood stability Next injection due 03/26/17 1 each 0  . [DISCONTINUED] LEVONORGESTREL-ETHINYL ESTRAD PO Take 1 tablet by mouth daily.     Allergies  Allergen Reactions  . Peanut-Containing Drug Products Anaphylaxis  . Penicillins Other (See Comments)    Reaction:  Unknown Has patient had a PCN reaction causing immediate rash, facial/tongue/throat swelling, SOB or lightheadedness with hypotension: Unsure Has patient had a PCN reaction causing severe rash involving mucus membranes or skin necrosis: Unsure Has patient had a PCN reaction that required hospitalization Unsure Has patient had a PCN reaction occurring  within the last 10 years: No If all of the above answers are "NO", then may proceed with Cephalosporin use.  Marland Kitchen Amoxicillin Other (See Comments)    Reaction:  Unknown  Has patient had a PCN reaction causing immediate rash, facial/tongue/throat swelling, SOB or lightheadedness with hypotension: Unsure Has patient had a PCN reaction causing severe rash involving mucus membranes or skin necrosis: Unsure Has patient had a PCN reaction that required hospitalization Unsure Has patient had a PCN reaction occurring within the last 10 years: No If all of the above answers are "NO", then may proceed with Cephalosporin use.  .  Food Other (See Comments)    Pt is allergic to all melons and nuts.   Reaction:  Unknown     ROS:  Review of Systems  Constitutional: Negative for chills, fatigue and fever.  HENT: Negative for sinus pressure.   Eyes: Negative for photophobia.  Respiratory: Negative for shortness of breath.   Cardiovascular: Negative for chest pain.  Gastrointestinal: Positive for abdominal pain. Negative for constipation, diarrhea, nausea and vomiting.  Genitourinary: Negative for difficulty urinating, dysuria, flank pain and frequency.       Vaginal bleeding  Musculoskeletal: Negative for neck pain.  Neurological: Negative for dizziness, weakness and headaches.  Psychiatric/Behavioral: Negative.      I have reviewed patient's Past Medical Hx, Surgical Hx, Family Hx, Social Hx, medications and allergies.   Physical Exam   Patient Vitals for the past 24 hrs:  BP Temp Temp src Pulse Resp SpO2 Weight  06/04/17 1300 123/82 - - - - - -  06/04/17 1021 124/85 98.6 F (37 C) Oral 87 16 99 % 132 lb 8 oz (60.1 kg)   Constitutional: Well-developed, well-nourished female in no acute distress.  Cardiovascular: normal rate Respiratory: normal effort GI: Abd soft, non-tender. Pos BS x 4 MS: Extremities nontender, no edema, normal ROM Neurologic: Alert and oriented x 4.  GU: Neg CVAT.  PELVIC EXAM: Deferred due to pt young age, pad count reveals 1/2 pad soaked in >1 hour in MAU Bimanual exam: Cervix 0/long/high, firm, anterior, neg CMT, uterus nontender, nonenlarged, adnexa without tenderness, enlargement, or mass, pt with some pain and tightening of vaginal muscles during exam, c/w vaginismus, but able to relax and tolerated exam well   LAB RESULTS Results for orders placed or performed during the hospital encounter of 06/04/17 (from the past 24 hour(s))  Urinalysis, Routine w reflex microscopic     Status: Abnormal   Collection Time: 06/04/17 10:23 AM  Result Value Ref Range   Color, Urine YELLOW  YELLOW   APPearance HAZY (A) CLEAR   Specific Gravity, Urine 1.026 1.005 - 1.030   pH 6.0 5.0 - 8.0   Glucose, UA NEGATIVE NEGATIVE mg/dL   Hgb urine dipstick LARGE (A) NEGATIVE   Bilirubin Urine NEGATIVE NEGATIVE   Ketones, ur NEGATIVE NEGATIVE mg/dL   Protein, ur 30 (A) NEGATIVE mg/dL   Nitrite NEGATIVE NEGATIVE   Leukocytes, UA TRACE (A) NEGATIVE   RBC / HPF TOO NUMEROUS TO COUNT 0 - 5 RBC/hpf   WBC, UA 6-30 0 - 5 WBC/hpf   Bacteria, UA RARE (A) NONE SEEN   Squamous Epithelial / LPF 0-5 (A) NONE SEEN   Mucus PRESENT   CBC     Status: None   Collection Time: 06/04/17 11:37 AM  Result Value Ref Range   WBC 10.1 4.0 - 10.5 K/uL   RBC 4.77 3.87 - 5.11 MIL/uL   Hemoglobin 13.7 12.0 - 15.0  g/dL   HCT 16.1 09.6 - 04.5 %   MCV 86.4 78.0 - 100.0 fL   MCH 28.7 26.0 - 34.0 pg   MCHC 33.3 30.0 - 36.0 g/dL   RDW 40.9 81.1 - 91.4 %   Platelets 248 150 - 400 K/uL  Pregnancy, urine POC     Status: None   Collection Time: 06/04/17 12:31 PM  Result Value Ref Range   Preg Test, Ur NEGATIVE NEGATIVE  Wet prep, genital     Status: None   Collection Time: 06/04/17 12:45 PM  Result Value Ref Range   Yeast Wet Prep HPF POC NONE SEEN NONE SEEN   Trich, Wet Prep NONE SEEN NONE SEEN   Clue Cells Wet Prep HPF POC NONE SEEN NONE SEEN   WBC, Wet Prep HPF POC NONE SEEN NONE SEEN   Sperm NONE SEEN        IMAGING No results found.  MAU Management/MDM: Ordered labs and reviewed results.  No evidence of infection, pelvic exam wnl.  Bleeding is most likely anovulatory cycles related to stress and/or changes in medications made when pt inpatient for behavioral health.   Pt interested in contraception. Discussed LARCs as most effective form of birth control.  Pt considering.  Will follow up in office. OCPs prescribed for pt to start today.  Dyspareunia c/w vaginismus. Printed teaching given. Pt encouraged to try relaxation techniques.  Will f/u in office.  Pt discharged with strict bleeding  precautions.  ASSESSMENT 1. Abnormal uterine bleeding (AUB)   2. Dyspareunia, female     PLAN Discharge home Allergies as of 06/04/2017      Reactions   Peanut-containing Drug Products Anaphylaxis   Penicillins Other (See Comments)   Reaction:  Unknown Has patient had a PCN reaction causing immediate rash, facial/tongue/throat swelling, SOB or lightheadedness with hypotension: Unsure Has patient had a PCN reaction causing severe rash involving mucus membranes or skin necrosis: Unsure Has patient had a PCN reaction that required hospitalization Unsure Has patient had a PCN reaction occurring within the last 10 years: No If all of the above answers are "NO", then may proceed with Cephalosporin use.   Amoxicillin Other (See Comments)   Reaction:  Unknown  Has patient had a PCN reaction causing immediate rash, facial/tongue/throat swelling, SOB or lightheadedness with hypotension: Unsure Has patient had a PCN reaction causing severe rash involving mucus membranes or skin necrosis: Unsure Has patient had a PCN reaction that required hospitalization Unsure Has patient had a PCN reaction occurring within the last 10 years: No If all of the above answers are "NO", then may proceed with Cephalosporin use.   Food Other (See Comments)   Pt is allergic to all melons and nuts.   Reaction:  Unknown       Medication List    STOP taking these medications   naproxen 375 MG tablet Commonly known as:  NAPROSYN     TAKE these medications   ARIPiprazole ER 400 MG Srer Inject 400 mg into the muscle every 28 (twenty-eight) days. For mood stability Next injection due 03/26/17 What changed:  Another medication with the same name was removed. Continue taking this medication, and follow the directions you see here.   norgestimate-ethinyl estradiol 0.25-35 MG-MCG tablet Commonly known as:  ORTHO-CYCLEN,SPRINTEC,PREVIFEM Take 1 tablet by mouth daily.      Follow-up Information    [facility] for  [facility] Follow up.   Specialty:  Obstetrics and Gynecology Why:  The office will call you  to schedule an appointment. Return to MAU as needed for emergencies. Contact information: [address] [place] [id number] [phone]          Sharen CounterLisa Leftwich-Kirby Certified Nurse-Midwife 06/04/2017  6:28 PM

## 2017-06-05 LAB — GC/CHLAMYDIA PROBE AMP (~~LOC~~) NOT AT ARMC
CHLAMYDIA, DNA PROBE: NEGATIVE
NEISSERIA GONORRHEA: NEGATIVE

## 2017-06-20 ENCOUNTER — Encounter: Payer: Self-pay | Admitting: Advanced Practice Midwife

## 2018-05-16 ENCOUNTER — Emergency Department (HOSPITAL_COMMUNITY)
Admission: EM | Admit: 2018-05-16 | Discharge: 2018-05-16 | Disposition: A | Discharge status: Home / Self Care | Payer: No Typology Code available for payment source | Attending: Emergency Medicine | Admitting: Emergency Medicine

## 2018-05-16 ENCOUNTER — Encounter (HOSPITAL_COMMUNITY): Payer: Self-pay | Admitting: Emergency Medicine

## 2018-05-16 ENCOUNTER — Other Ambulatory Visit: Payer: Self-pay

## 2018-05-16 DIAGNOSIS — F1410 Cocaine abuse, uncomplicated: Secondary | ICD-10-CM | POA: Diagnosis not present

## 2018-05-16 DIAGNOSIS — R45851 Suicidal ideations: Secondary | ICD-10-CM | POA: Diagnosis not present

## 2018-05-16 DIAGNOSIS — F329 Major depressive disorder, single episode, unspecified: Secondary | ICD-10-CM | POA: Diagnosis not present

## 2018-05-16 DIAGNOSIS — Z590 Homelessness: Secondary | ICD-10-CM | POA: Insufficient documentation

## 2018-05-16 DIAGNOSIS — Z5321 Procedure and treatment not carried out due to patient leaving prior to being seen by health care provider: Secondary | ICD-10-CM | POA: Diagnosis not present

## 2018-05-16 LAB — CBC
HCT: 39.3 % (ref 36.0–46.0)
Hemoglobin: 13.1 g/dL (ref 12.0–15.0)
MCH: 29.2 pg (ref 26.0–34.0)
MCHC: 33.3 g/dL (ref 30.0–36.0)
MCV: 87.5 fL (ref 80.0–100.0)
PLATELETS: 314 10*3/uL (ref 150–400)
RBC: 4.49 MIL/uL (ref 3.87–5.11)
RDW: 13.2 % (ref 11.5–15.5)
WBC: 11 10*3/uL — AB (ref 4.0–10.5)
nRBC: 0 % (ref 0.0–0.2)

## 2018-05-16 LAB — RAPID URINE DRUG SCREEN, HOSP PERFORMED
AMPHETAMINES: NOT DETECTED
BENZODIAZEPINES: NOT DETECTED
Barbiturates: NOT DETECTED
COCAINE: POSITIVE — AB
Opiates: NOT DETECTED
TETRAHYDROCANNABINOL: POSITIVE — AB

## 2018-05-16 LAB — COMPREHENSIVE METABOLIC PANEL
ALBUMIN: 4.3 g/dL (ref 3.5–5.0)
ALT: 23 U/L (ref 0–44)
AST: 49 U/L — AB (ref 15–41)
Alkaline Phosphatase: 56 U/L (ref 38–126)
Anion gap: 11 (ref 5–15)
BUN: 7 mg/dL (ref 6–20)
CHLORIDE: 108 mmol/L (ref 98–111)
CO2: 18 mmol/L — AB (ref 22–32)
Calcium: 9.1 mg/dL (ref 8.9–10.3)
Creatinine, Ser: 0.56 mg/dL (ref 0.44–1.00)
Glucose, Bld: 99 mg/dL (ref 70–99)
Potassium: 3.2 mmol/L — ABNORMAL LOW (ref 3.5–5.1)
SODIUM: 137 mmol/L (ref 135–145)
TOTAL PROTEIN: 7.4 g/dL (ref 6.5–8.1)
Total Bilirubin: 0.7 mg/dL (ref 0.3–1.2)

## 2018-05-16 LAB — ETHANOL: ALCOHOL ETHYL (B): 128 mg/dL — AB (ref <10)

## 2018-05-16 LAB — ACETAMINOPHEN LEVEL: Acetaminophen (Tylenol), Serum: <10 ug/mL — ABNORMAL LOW (ref 10–30)

## 2018-05-16 LAB — SALICYLATE LEVEL: Salicylate Lvl: <7 mg/dL (ref 2.8–30.0)

## 2018-05-16 NOTE — ED Triage Notes (Signed)
Pt BIB [facility], voluntary, pt states she was recently released from jail, is homeless and feels like she is a mistake. Reports using crack last night. Feels hopeless and suicidal with no plan.

## 2018-05-16 NOTE — ED Notes (Signed)
Pt yelling in the hall, states staff are "pieces of shit" and she hasn't gotten any help. Security present.

## 2018-05-17 NOTE — ED Notes (Signed)
Pt decided to leave, escorted out by [facility] and security

## 2018-05-18 LAB — I-STAT BETA HCG BLOOD, ED (MC, WL, AP ONLY): I-stat hCG, quantitative: <5 m[IU]/mL (ref <5)

## 2018-05-19 ENCOUNTER — Emergency Department (HOSPITAL_COMMUNITY)
Admission: EM | Admit: 2018-05-19 | Discharge: 2018-05-19 | Disposition: A | Discharge status: Other Acute Inpatient Hospital | Payer: No Typology Code available for payment source | Attending: Emergency Medicine | Admitting: Emergency Medicine

## 2018-05-19 ENCOUNTER — Encounter (HOSPITAL_COMMUNITY): Payer: Self-pay

## 2018-05-19 ENCOUNTER — Other Ambulatory Visit: Payer: Self-pay

## 2018-05-19 ENCOUNTER — Emergency Department (HOSPITAL_COMMUNITY): Payer: No Typology Code available for payment source

## 2018-05-19 ENCOUNTER — Inpatient Hospital Stay (HOSPITAL_COMMUNITY)
Admission: AD | Admit: 2018-05-19 | Discharge: 2018-05-21 | DRG: 885 | Disposition: A | Discharge status: Home / Self Care | Payer: No Typology Code available for payment source | Source: Intra-hospital | Attending: Psychiatry | Admitting: Psychiatry

## 2018-05-19 DIAGNOSIS — J45909 Unspecified asthma, uncomplicated: Secondary | ICD-10-CM | POA: Insufficient documentation

## 2018-05-19 DIAGNOSIS — F17210 Nicotine dependence, cigarettes, uncomplicated: Secondary | ICD-10-CM | POA: Insufficient documentation

## 2018-05-19 DIAGNOSIS — F1910 Other psychoactive substance abuse, uncomplicated: Secondary | ICD-10-CM | POA: Diagnosis not present

## 2018-05-19 DIAGNOSIS — Z590 Homelessness: Secondary | ICD-10-CM | POA: Diagnosis not present

## 2018-05-19 DIAGNOSIS — F332 Major depressive disorder, recurrent severe without psychotic features: Secondary | ICD-10-CM | POA: Diagnosis present

## 2018-05-19 DIAGNOSIS — F1210 Cannabis abuse, uncomplicated: Secondary | ICD-10-CM | POA: Diagnosis present

## 2018-05-19 DIAGNOSIS — F609 Personality disorder, unspecified: Secondary | ICD-10-CM | POA: Diagnosis present

## 2018-05-19 DIAGNOSIS — E039 Hypothyroidism, unspecified: Secondary | ICD-10-CM | POA: Diagnosis present

## 2018-05-19 DIAGNOSIS — F4310 Post-traumatic stress disorder, unspecified: Secondary | ICD-10-CM | POA: Diagnosis present

## 2018-05-19 DIAGNOSIS — Z91010 Allergy to peanuts: Secondary | ICD-10-CM | POA: Insufficient documentation

## 2018-05-19 DIAGNOSIS — F329 Major depressive disorder, single episode, unspecified: Secondary | ICD-10-CM | POA: Diagnosis present

## 2018-05-19 DIAGNOSIS — F913 Oppositional defiant disorder: Secondary | ICD-10-CM | POA: Diagnosis present

## 2018-05-19 DIAGNOSIS — K625 Hemorrhage of anus and rectum: Secondary | ICD-10-CM | POA: Diagnosis not present

## 2018-05-19 DIAGNOSIS — F322 Major depressive disorder, single episode, severe without psychotic features: Secondary | ICD-10-CM | POA: Insufficient documentation

## 2018-05-19 DIAGNOSIS — F648 Other gender identity disorders: Secondary | ICD-10-CM | POA: Diagnosis present

## 2018-05-19 DIAGNOSIS — Z91018 Allergy to other foods: Secondary | ICD-10-CM | POA: Diagnosis not present

## 2018-05-19 DIAGNOSIS — Z880 Allergy status to penicillin: Secondary | ICD-10-CM

## 2018-05-19 DIAGNOSIS — Y905 Blood alcohol level of 100-119 mg/100 ml: Secondary | ICD-10-CM | POA: Diagnosis present

## 2018-05-19 DIAGNOSIS — F419 Anxiety disorder, unspecified: Secondary | ICD-10-CM | POA: Diagnosis present

## 2018-05-19 DIAGNOSIS — Z23 Encounter for immunization: Secondary | ICD-10-CM

## 2018-05-19 DIAGNOSIS — F1410 Cocaine abuse, uncomplicated: Secondary | ICD-10-CM | POA: Diagnosis present

## 2018-05-19 DIAGNOSIS — Z765 Malingerer [conscious simulation]: Secondary | ICD-10-CM

## 2018-05-19 DIAGNOSIS — R45851 Suicidal ideations: Secondary | ICD-10-CM | POA: Diagnosis present

## 2018-05-19 DIAGNOSIS — R41843 Psychomotor deficit: Secondary | ICD-10-CM | POA: Diagnosis present

## 2018-05-19 LAB — COMPREHENSIVE METABOLIC PANEL
ALT: 21 U/L (ref 0–44)
ANION GAP: 11 (ref 5–15)
AST: 52 U/L — ABNORMAL HIGH (ref 15–41)
Albumin: 4.5 g/dL (ref 3.5–5.0)
Alkaline Phosphatase: 70 U/L (ref 38–126)
BUN: 7 mg/dL (ref 6–20)
CO2: 23 mmol/L (ref 22–32)
Calcium: 8.8 mg/dL — ABNORMAL LOW (ref 8.9–10.3)
Chloride: 108 mmol/L (ref 98–111)
Creatinine, Ser: 0.6 mg/dL (ref 0.44–1.00)
GFR calc Af Amer: >60 mL/min (ref >60)
GFR calc non Af Amer: >60 mL/min (ref >60)
Glucose, Bld: 133 mg/dL — ABNORMAL HIGH (ref 70–99)
Potassium: 3.2 mmol/L — ABNORMAL LOW (ref 3.5–5.1)
Sodium: 142 mmol/L (ref 135–145)
Total Bilirubin: 0.6 mg/dL (ref 0.3–1.2)
Total Protein: 7.4 g/dL (ref 6.5–8.1)

## 2018-05-19 LAB — RAPID URINE DRUG SCREEN, HOSP PERFORMED
Amphetamines: NOT DETECTED
Barbiturates: NOT DETECTED
Benzodiazepines: NOT DETECTED
Cocaine: POSITIVE — AB
OPIATES: NOT DETECTED
Tetrahydrocannabinol: POSITIVE — AB

## 2018-05-19 LAB — CBC
HCT: 40.3 % (ref 36.0–46.0)
Hemoglobin: 13 g/dL (ref 12.0–15.0)
MCH: 29.1 pg (ref 26.0–34.0)
MCHC: 32.3 g/dL (ref 30.0–36.0)
MCV: 90.2 fL (ref 80.0–100.0)
PLATELETS: 284 10*3/uL (ref 150–400)
RBC: 4.47 MIL/uL (ref 3.87–5.11)
RDW: 13.7 % (ref 11.5–15.5)
WBC: 8.2 10*3/uL (ref 4.0–10.5)
nRBC: 0 % (ref 0.0–0.2)

## 2018-05-19 LAB — ETHANOL: Alcohol, Ethyl (B): 100 mg/dL — ABNORMAL HIGH (ref <10)

## 2018-05-19 LAB — I-STAT BETA HCG BLOOD, ED (MC, WL, AP ONLY): I-stat hCG, quantitative: <5 m[IU]/mL (ref <5)

## 2018-05-19 LAB — ACETAMINOPHEN LEVEL

## 2018-05-19 LAB — SALICYLATE LEVEL: Salicylate Lvl: <7 mg/dL (ref 2.8–30.0)

## 2018-05-19 MED ORDER — MAGNESIUM HYDROXIDE 400 MG/5ML PO SUSP: 30.0000 mL | Freq: Every day | ORAL | Status: DC | PRN

## 2018-05-19 MED ORDER — ALUM & MAG HYDROXIDE-SIMETH 200-200-20 MG/5ML PO SUSP: 30.0000 mL | ORAL | Status: DC | PRN

## 2018-05-19 MED ORDER — ACETAMINOPHEN 325 MG PO TABS: 650.0000 mg | ORAL_TABLET | ORAL | Status: DC | PRN

## 2018-05-19 MED ORDER — HYDROXYZINE HCL 25 MG PO TABS: 25.0000 mg | ORAL_TABLET | Freq: Three times a day (TID) | ORAL | Status: DC | PRN

## 2018-05-19 MED ORDER — PNEUMOCOCCAL VAC POLYVALENT 25 MCG/0.5ML IJ INJ
0.5000 mL | INJECTION | INTRAMUSCULAR | Status: AC
  Administered 2018-05-20: 0.5 mL via INTRAMUSCULAR

## 2018-05-19 MED ORDER — TRAZODONE HCL 100 MG PO TABS
100.0000 mg | ORAL_TABLET | Freq: Every evening | ORAL | Status: DC | PRN
  Administered 2018-05-20: 100 mg via ORAL
  Filled 2018-05-19: qty 1

## 2018-05-19 MED ORDER — INFLUENZA VAC SPLIT QUAD 0.5 ML IM SUSY
0.5000 mL | PREFILLED_SYRINGE | INTRAMUSCULAR | Status: AC
  Administered 2018-05-20: 0.5 mL via INTRAMUSCULAR
  Filled 2018-05-19: qty 0.5

## 2018-05-19 NOTE — BHH Counselor (Signed)
Patient has been accepted to [facility] at 8 p.m.

## 2018-05-19 NOTE — BHH Counselor (Signed)
TTS unable to assess patient because he would not wake up to participate in assessment.

## 2018-05-19 NOTE — ED Notes (Signed)
Pt sleeping at present, no distress noted, calm & cooperative.  Pending report to [facility] and [facility] transport after 8pm.  Monitoring for safety, Q 15 min checks in effect.

## 2018-05-19 NOTE — ED Notes (Signed)
Bed: [facility] Expected date:  Expected time:  Means of arrival:  Comments: 
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
## 2018-05-19 NOTE — ED Notes (Signed)
Patient resting in bed quietly, no acute distress.

## 2018-05-19 NOTE — ED Triage Notes (Signed)
Pt here for help with substance abuse and feeling suicidal, he was just released from jail about 4 days ago and is homeless and hasn't had his meds in a few days

## 2018-05-19 NOTE — ED Provider Notes (Signed)
[facility]-EMERGENCY DEPT Provider Note   CSN: [id number] Arrival date & time: 05/19/18  0446     History   Chief Complaint Chief Complaint  Patient presents with  . Suicidal    HPI Kerri Parks is a 23 y.o. adult.  She goes by the female [place].  He presents to the emergency department complaining of suicidal ideation with a plan to run out in front of a car in traffic.  History of suicide attempts in the past.  Also polysubstance abuse with crack marijuana alcohol.  Only medical complaint is some rectal bleeding which she attributes to swelling a stable 2 days ago while in jail.  Says he has not been taking his Lamictal for a couple of days.  The history is provided by the patient.  Mental Health Problem  Presenting symptoms: depression and suicidal thoughts   Onset quality:  Unable to specify Timing:  Intermittent Progression:  Worsening Chronicity:  Recurrent Context: alcohol use, drug abuse and noncompliance   Treatment compliance:  Unable to specify Relieved by:  Nothing Worsened by:  Nothing Ineffective treatments:  None tried Associated symptoms: no abdominal pain, no chest pain and no headaches   Risk factors: hx of mental illness and hx of suicide attempts     Past Medical History:  Diagnosis Date  . Anemia   . Anxiety   . Asthma   . Bipolar 1 disorder (HCC)   . Conduct disorder   . Depression   . Hypothyroidism 08/05/2013   Per pt report  . Oppositional defiant behavior   . Overdose 03/07/2016   intential     Patient Active Problem List   Diagnosis Date Noted  . Bipolar disorder (HCC) 02/23/2017  . Borderline personality disorder (HCC) 02/22/2017  . Intentional overdose of drug in tablet form (HCC) 02/21/2017  . Suicidal ideation   . Bipolar affective disorder, depressed, mild (HCC) 03/09/2016  . Overdose 03/07/2016  . Encounter for central line placement   . Lithium toxicity   . Prolonged QT interval   . Suicide attempt  (HCC)   . Food allergy 11/08/2015  . Foster child 11/08/2015  . Iron deficiency anemia 11/08/2015  . Asthma, chronic 08/19/2013    Past Surgical History:  Procedure Laterality Date  . UPPER GI ENDOSCOPY       OB History   No obstetric history on file.      Home Medications    Prior to Admission medications   Medication Sig Start Date End Date Taking? Authorizing Provider  ARIPiprazole ER 400 MG SRER Inject 400 mg into the muscle every 28 (twenty-eight) days. For mood stability Next injection due 03/26/17 Patient not taking: Reported on 05/19/2018 02/26/17   Money, Gerlene Burdock, FNP  norgestimate-ethinyl estradiol (ORTHO-CYCLEN,SPRINTEC,PREVIFEM) 0.25-35 MG-MCG tablet Take 1 tablet by mouth daily. Patient not taking: Reported on 05/19/2018 06/04/17   Leftwich-Kirby, Misty Stanley A, CNM  LEVONORGESTREL-ETHINYL ESTRAD PO Take 1 tablet by mouth daily.  08/19/13  [provider]    Family History Family History  Family history unknown: Yes    Social History Social History   Tobacco Use  . Smoking status: Current Every Day Smoker    Packs/day: 0.25    Years: 2.00    Pack years: 0.50    Types: Cigarettes  . Smokeless tobacco: Never Used  Substance Use Topics  . Alcohol use: Yes    Alcohol/week: 5.0 standard drinks    Types: 5 Cans of beer per week  Comment: in the past  . Drug use: Yes    Types: Marijuana, Cocaine    Comment: "whenever she can get it"     Allergies   Peanut-containing drug products; Penicillins; Amoxicillin; and Food   Review of Systems Review of Systems  Constitutional: Negative for fever.  HENT: Negative for sore throat.   Eyes: Negative for visual disturbance.  Respiratory: Negative for shortness of breath.   Cardiovascular: Negative for chest pain.  Gastrointestinal: Positive for anal bleeding. Negative for abdominal pain and rectal pain.  Genitourinary: Negative for dysuria.  Musculoskeletal: Negative for back pain.  Skin: Negative for  rash.  Neurological: Negative for headaches.  Psychiatric/Behavioral: Positive for suicidal ideas.     Physical Exam Updated Vital Signs BP 132/90 (BP Location: Left Arm)   Pulse 87   Temp 98.8 F (37.1 C) (Oral)   Resp 15   Ht 5\' 4"  (1.626 m)   Wt 57.1 kg   SpO2 98%   BMI 21.59 kg/m   Physical Exam Vitals signs and nursing note reviewed.  Constitutional:      General: He is not in acute distress.    Appearance: He is well-developed.  HENT:     Head: Normocephalic and atraumatic.  Eyes:     Conjunctiva/sclera: Conjunctivae normal.  Neck:     Musculoskeletal: Neck supple.  Cardiovascular:     Rate and Rhythm: Normal rate and regular rhythm.     Heart sounds: No murmur.  Pulmonary:     Effort: Pulmonary effort is normal. No respiratory distress.     Breath sounds: Normal breath sounds.  Abdominal:     Palpations: Abdomen is soft.     Tenderness: There is no abdominal tenderness.  Musculoskeletal: Normal range of motion.        General: No tenderness.  Skin:    General: Skin is warm and dry.     Capillary Refill: Capillary refill takes less than 2 seconds.  Neurological:     General: No focal deficit present.     Mental Status: He is alert.  Psychiatric:        Behavior: Behavior is withdrawn.        Thought Content: Thought content includes suicidal ideation. Thought content includes suicidal plan.      ED Treatments / Results  Labs (all labs ordered are listed, but only abnormal results are displayed) Labs Reviewed  COMPREHENSIVE METABOLIC PANEL - Abnormal; Notable for the following components:      Result Value   Potassium 3.2 (*)    Glucose, Bld 133 (*)    Calcium 8.8 (*)    AST 52 (*)    All other components within normal limits  ETHANOL - Abnormal; Notable for the following components:   Alcohol, Ethyl (B) 100 (*)    All other components within normal limits  ACETAMINOPHEN LEVEL - Abnormal; Notable for the following components:   Acetaminophen  (Tylenol), Serum <10 (*)    All other components within normal limits  SALICYLATE LEVEL  CBC  RAPID URINE DRUG SCREEN, HOSP PERFORMED  I-STAT BETA HCG BLOOD, ED ([facility], [facility], [facility] ONLY)    EKG None  Radiology Dg Abdomen 1 View  Result Date: 05/19/2018 CLINICAL DATA:  Swallowed a staple. EXAM: ABDOMEN - 1 VIEW COMPARISON:  None. FINDINGS: The bowel gas pattern is normal. No radio-opaque calculi or other significant radiographic abnormality are seen. IMPRESSION: No metallic foreign body over the abdomen. Electronically Signed   By: Kathrynn DuckingJonathon  Watts M.D.  On: 05/19/2018 07:47    Procedures Procedures (including critical care time)  Medications Ordered in ED Medications  acetaminophen (TYLENOL) tablet 650 mg (has no administration in time range)     Initial Impression / Assessment and Plan / ED Course  I have reviewed the triage vital signs and the nursing notes.  Pertinent labs & imaging results that were available during my care of the patient were reviewed by me and considered in my medical decision making (see chart for details).  Clinical Course as of May 19 1453  Tue May 19, 2018  [id number] Patient had complained to me of rectal bleeding.  Hemoglobin here unremarkable.  Scout KUB does not show any foreign body.  Approached patient regarding doing a rectal exam and patient refused.   [MB]  1406 Patient has been evaluated and no life-threatening medical conditions have been identified on a medical screening exam.     [MB]  1418 Behavioral health has evaluated the patient and although they have not talk to me they left a note and my impression is that there thinking that he will need to be inpatient.   [MB]    Clinical Course User Index [MB] Terrilee FilesButler, Michael C, MD     Final Clinical Impressions(s) / ED Diagnoses   Final diagnoses:  Suicidal ideation  Painless rectal bleeding    ED Discharge Orders    None       Terrilee FilesButler, Michael C, MD 05/19/18 1456

## 2018-05-19 NOTE — ED Notes (Signed)
Report called to RN Doris, [facility], rm [id number].  Pending [facility] transport.

## 2018-05-19 NOTE — BH Assessment (Addendum)
[facility] Assessment Progress Note  Per Juanetta Beets, DO, this pt requires psychiatric hospitalization at this time.  Berneice Heinrich, RN, [facility] has assigned pt to [facility] Rm [id number]; [facility] will be ready to receive pt at 20:00.  Pt has signed Voluntary Admission and Consent for Treatment, as well as Consent to Release Information to no one, and signed forms have been faxed to [facility].  Pt's nurse, Gabriel Earing, has been notified, and agrees to send original paperwork along with pt via Juel Burrow, and to call report to [phone].  Doylene Canning, [place] [facility] Coordinator [phone]
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
## 2018-05-19 NOTE — ED Notes (Signed)
Patient refused POC occult blood exam.

## 2018-05-19 NOTE — ED Notes (Signed)
[facility] Expected date:  Expected time:  Means of arrival:  Comments: Hold for room 30

## 2018-05-20 ENCOUNTER — Encounter (HOSPITAL_COMMUNITY): Payer: Self-pay | Admitting: Nurse Practitioner

## 2018-05-20 DIAGNOSIS — F332 Major depressive disorder, recurrent severe without psychotic features: Principal | ICD-10-CM

## 2018-05-20 DIAGNOSIS — F4310 Post-traumatic stress disorder, unspecified: Secondary | ICD-10-CM

## 2018-05-20 DIAGNOSIS — F1910 Other psychoactive substance abuse, uncomplicated: Secondary | ICD-10-CM

## 2018-05-20 MED ORDER — DIPHENHYDRAMINE HCL 25 MG PO CAPS
50.0000 mg | ORAL_CAPSULE | ORAL | Status: DC | PRN
  Administered 2018-05-20: 50 mg via ORAL
  Filled 2018-05-20: qty 2

## 2018-05-20 MED ORDER — AVEENO MOISTURIZING EX BAR
CHEWABLE_BAR | Freq: Every day | CUTANEOUS | Status: DC | PRN
  Administered 2018-05-20: 18:00:00 via TOPICAL
  Filled 2018-05-20: qty 1

## 2018-05-20 MED ORDER — ENSURE ENLIVE PO LIQD
237.0000 mL | Freq: Two times a day (BID) | ORAL | Status: DC
  Administered 2018-05-20: 237 mL via ORAL

## 2018-05-20 MED ORDER — QUETIAPINE FUMARATE 50 MG PO TABS
50.0000 mg | ORAL_TABLET | Freq: Three times a day (TID) | ORAL | Status: DC
  Administered 2018-05-20 – 2018-05-21 (×2): 50 mg via ORAL
  Filled 2018-05-20 (×11): qty 1
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
## 2018-05-20 NOTE — Progress Notes (Signed)
Patient ID: Kerri LenisMercedes R Parks, adult   DOB: 10/04/1995, 22 y.o.   MRN: [id number] Pt refused to get up this morning to get her labs drawn, was given multiple positive verbal reinforcements to do so, and refused. Pt was informed that this is part of her treatment and that she might be discharged due to her inability to be compliant with treatment and she stated: "I don't care". Pt was also educated on the fact that food will not be brought back to the unit for her.

## 2018-05-20 NOTE — Progress Notes (Signed)
D: Pt has remained in bed most of the day. After peer support sat with this pt and had a conversation with the pt, the pt has seen been cooperative in coming to the medication room and taking medication. Pt has also been more forthcoming and conversing with this Clinical research associate. Pt has been ordered/given Aveeno soap to bath with d/t pt's lips becoming swollen and broken out after bathing with the soap provided. Pt still presences as irritable and caution though he has been more interactive. Pt states that he prefers the pronouns he and him not she/her or them/they. Pt reports that he would rather be programing on the 400 hall. Pt also reports that he would like to go back to [facility]. Pt has an ACT Team there. Pt states "things were going good there until I messed things up. Pt has been passively SI today. Pt does verbally contract for safety.  Pt stated this evening the reason he does not go to meal is because of the appears of his lip being broken out and not wanting people to stare and talk about him.   A: Scheduled medications administered to pt, per MD orders. Support and encouragement provided. Frequent verbal contact made. Routine safety checks conducted q15 minutes.   R: No adverse drug reactions noted. Pt verbally contracts for safety at this time. Pt complaint with medications since this afternoon. Pt has remained isolative most of the day. Pt remains safe at this time. Will continue to monitor.

## 2018-05-20 NOTE — [facility] Suicide Risk Assessment (Signed)
[facility] INPATIENT:  Family/Significant Other Suicide Prevention Education  Suicide Prevention Education:  Patient Refusal for Family/Significant Other Suicide Prevention Education: The patient Kerri Parks has refused to provide written consent for family/significant other to be provided Family/Significant Other Suicide Prevention Education during admission and/or prior to discharge.  Physician notified.  SPE completed with pt, as pt refused to consent to family contact. SPI pamphlet provided to pt and pt was encouraged to share information with support network, ask questions, and talk about any concerns relating to SPE. Pt denies access to guns/firearms and verbalized understanding of information provided. Mobile Crisis information also provided to pt.   Rona Ravens LCSW 05/20/2018, 2:30 PM

## 2018-05-20 NOTE — Progress Notes (Signed)
Pt invited, but declined AA group this evening.  

## 2018-05-20 NOTE — Progress Notes (Addendum)
NUTRITION ASSESSMENT  Pt identified as at risk on the Malnutrition Screen Tool  INTERVENTION: - Continue Ensure Enlive BID, each supplement provides 350 kcal and 20 grams of protein. - Continue to encourage PO intakes.   NUTRITION DIAGNOSIS: Inadequate oral intake related to acute illness, mental health as evidenced by patient report.   Goal: Pt to meet >/= 90% of their estimated nutrition needs.  Monitor:  PO intake  Assessment:  Patient admitted for SI with a plan to run in front of a car.  He has hx of polysubstance abuse using crack, marijuana, and alcohol. He reported that he had been binge drinking x3 days PTA.  Per chart review, current weight is 128 lb and weight has been mainly stable for the past 2 years.   22 y.o. adult  Height: Ht Readings from Last 1 Encounters:  05/19/18 5\' 3"  (1.6 m)    Weight: Wt Readings from Last 1 Encounters:  05/19/18 58.1 kg    Weight Hx: Wt Readings from Last 10 Encounters:  05/19/18 58.1 kg  05/19/18 57.1 kg  06/04/17 60.1 kg (60 %, Z= 0.25)*  02/23/17 58.5 kg (55 %, Z= 0.12)*  02/22/17 63 kg (70 %, Z= 0.54)*  05/28/16 63.4 kg (74 %, Z= 0.65)*  04/16/16 60.3 kg (65 %, Z= 0.40)*  03/30/16 60.7 kg (67 %, Z= 0.43)*  03/25/16 62.1 kg (71 %, Z= 0.56)*  03/09/16 61.7 kg (70 %, Z= 0.53)*   * Growth percentiles are based on CDC (Girls, 2-20 Years) data.    BMI:  Body mass index is 22.67 kg/m. Pt meets criteria for normal weight based on current BMI.  Estimated Nutritional Needs: Kcal: 25-30 kcal/kg Protein: > 1 gram protein/kg Fluid: 1 ml/kcal  Diet Order:  Diet Order            Diet regular Room service appropriate? Yes; Fluid consistency: Thin  Diet effective now             Pt is also offered choice of unit snacks mid-morning and mid-afternoon.  Pt is eating as desired.   Lab results and medications reviewed.     Trenton Gammon, MS, RD, LDN, [facility] Inpatient Clinical Dietitian Pager # [phone] After  hours/weekend pager # [phone]

## 2018-05-20 NOTE — Progress Notes (Signed)
Recreation Therapy Notes  Date: 1.8.20 Time: 0930 Location: 300 Hall Dayroom  Group Topic: Stress Management  Goal Area(s) Addresses:  Patient will identify benefits of stress management. Patient will identify stress management techniques. Patient will identify benefits of using stress management post d/c.   Intervention: Stress Management  Activity :  Guided Imagery.  LRT introduced the stress management technique of guided imagery.  LRT read a script that took patients on a journey through a wildlife sanctuary.  Patients were to follow along as script was read to engage in activity.  Education:  Stress Management, Discharge Planning.   Education Outcome: Acknowledges Education  Clinical Observations/Feedback:  Pt did not attend group.    Caroll Rancher, LRT/CTRS         Caroll Rancher A 05/20/2018 10:41 AM
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
## 2018-05-20 NOTE — H&P (Signed)
Psychiatric Admission Assessment Adult  Patient Identification: Kerri Parks MRN:  [id number] Date of Evaluation:  05/20/2018 Chief Complaint:  MDD, Recurrent, Severe PTSD Polysubstance Use Disorder Principal Diagnosis: Polysubstance abuse/malingering Diagnosis:  Active Problems:   MDD (major depressive disorder), recurrent episode, severe (HCC)  History of Present Illness:   This 23 year old transgender individual acknowledges being released from prison and having "nowhere to go" so coming to the hospital but was in a state of impairment therefore was admitted blood alcohol level was 100, drug screen reflected abuse of cocaine and cannabis.  Patient is poorly cooperative the interview process stating they just want to sleep and refusing to cooperate with full evaluation other than basic mental status exam.  Patient irritable poorly cooperative and acknowledging coming to the hospital for housing purposes.   Associated Signs/Symptoms: Depression Symptoms:  psychomotor retardation, (Hypo) Manic Symptoms:  Irritable Mood, Anxiety Symptoms:  n/a Psychotic Symptoms:  n/a PTSD Symptoms: Denied despite traumatic exposure as child Total Time spent with patient: 30 minutes   Is the patient at risk to self? No.  Has the patient been a risk to self in the past 6 months? Yes.    Has the patient been a risk to self within the distant past? No.  Is the patient a risk to others? No.  Has the patient been a risk to others in the past 6 months? No.  Has the patient been a risk to others within the distant past? No.   Prior Inpatient Therapy:   Prior Outpatient Therapy:    Alcohol Screening: 1. How often do you have a drink containing alcohol?: 4 or more times a week 2. How many drinks containing alcohol do you have on a typical day when you are drinking?: 3 or 4 3. How often do you have six or more drinks on one occasion?: Monthly AUDIT-C Score: 7 4. How often during the last year have you  found that you were not able to stop drinking once you had started?: Less than monthly 5. How often during the last year have you failed to do what was normally expected from you becasue of drinking?: Monthly 6. How often during the last year have you needed a first drink in the morning to get yourself going after a heavy drinking session?: Less than monthly 7. How often during the last year have you had a feeling of guilt of remorse after drinking?: Less than monthly 8. How often during the last year have you been unable to remember what happened the night before because you had been drinking?: Less than monthly 9. Have you or someone else been injured as a result of your drinking?: No 10. Has a relative or friend or a doctor or another health worker been concerned about your drinking or suggested you cut down?: No Alcohol Use Disorder Identification Test Final Score (AUDIT): 13 Substance Abuse History in the last 12 months:  Yes.   Consequences of Substance Abuse: NA Previous Psychotropic Medications: Yes  Psychological Evaluations: No  Past Medical History:  Past Medical History:  Diagnosis Date  . Anemia   . Anxiety   . Asthma   . Bipolar 1 disorder (HCC)   . Conduct disorder   . Depression   . Hypothyroidism 08/05/2013   Per pt report  . Oppositional defiant behavior   . Overdose 03/07/2016   intential     Past Surgical History:  Procedure Laterality Date  . UPPER GI ENDOSCOPY     Family History:  Family History  Family history unknown: Yes   Family Psychiatric  History: ukn Tobacco Screening:   Social History:  Social History   Substance and Sexual Activity  Alcohol Use Yes  . Alcohol/week: 5.0 standard drinks  . Types: 5 Cans of beer per week   Comment: in the past     Social History   Substance and Sexual Activity  Drug Use Yes  . Types: Marijuana, Cocaine   Comment: "whenever she can get it"    Additional Social History:                            Allergies:   Allergies  Allergen Reactions  . Peanut-Containing Drug Products Anaphylaxis  . Penicillins Other (See Comments)    Reaction:  Unknown Has patient had a PCN reaction causing immediate rash, facial/tongue/throat swelling, SOB or lightheadedness with hypotension: Unsure Has patient had a PCN reaction causing severe rash involving mucus membranes or skin necrosis: Unsure Has patient had a PCN reaction that required hospitalization Unsure Has patient had a PCN reaction occurring within the last 10 years: No If all of the above answers are "NO", then may proceed with Cephalosporin use.  Marland Kitchen. Amoxicillin Other (See Comments)    Reaction:  Unknown  Has patient had a PCN reaction causing immediate rash, facial/tongue/throat swelling, SOB or lightheadedness with hypotension: Unsure Has patient had a PCN reaction causing severe rash involving mucus membranes or skin necrosis: Unsure Has patient had a PCN reaction that required hospitalization Unsure Has patient had a PCN reaction occurring within the last 10 years: No If all of the above answers are "NO", then may proceed with Cephalosporin use.  . Food Other (See Comments)    Pt is allergic to all melons and nuts.   Reaction:  Unknown    Lab Results:  Results for orders placed or performed during the hospital encounter of 05/19/18 (from the past 48 hour(s))  Rapid urine drug screen (hospital performed)     Status: Abnormal   Collection Time: 05/19/18  6:04 AM  Result Value Ref Range   Opiates NONE DETECTED NONE DETECTED   Cocaine POSITIVE (A) NONE DETECTED   Benzodiazepines NONE DETECTED NONE DETECTED   Amphetamines NONE DETECTED NONE DETECTED   Tetrahydrocannabinol POSITIVE (A) NONE DETECTED   Barbiturates NONE DETECTED NONE DETECTED    Comment: (NOTE) DRUG SCREEN FOR MEDICAL PURPOSES ONLY.  IF CONFIRMATION IS NEEDED FOR ANY PURPOSE, NOTIFY LAB WITHIN 5 DAYS. LOWEST DETECTABLE LIMITS FOR URINE DRUG SCREEN Drug Class                      Cutoff (ng/mL) Amphetamine and metabolites    1000 Barbiturate and metabolites    200 Benzodiazepine                 200 Tricyclics and metabolites     300 Opiates and metabolites        300 Cocaine and metabolites        300 THC                            50 Performed at [facility], [address] [place], [place] [address]   Comprehensive metabolic panel     Status: Abnormal   Collection Time: 05/19/18  6:34 AM  Result Value Ref Range   Sodium 142 135 - 145 mmol/L  Potassium 3.2 (L) 3.5 - 5.1 mmol/L   Chloride 108 98 - 111 mmol/L   CO2 23 22 - 32 mmol/L   Glucose, Bld 133 (H) 70 - 99 mg/dL   BUN 7 6 - 20 mg/dL   Creatinine, Ser 2.02 0.44 - 1.00 mg/dL   Calcium 8.8 (L) 8.9 - 10.3 mg/dL   Total Protein 7.4 6.5 - 8.1 g/dL   Albumin 4.5 3.5 - 5.0 g/dL   AST 52 (H) 15 - 41 U/L   ALT 21 0 - 44 U/L   Alkaline Phosphatase 70 38 - 126 U/L   Total Bilirubin 0.6 0.3 - 1.2 mg/dL   GFR calc non Af Amer >60 >60 mL/min   GFR calc Af Amer >60 >60 mL/min   Anion gap 11 5 - 15    Comment: Performed at [facility], [address] [place], [place] [address]  Ethanol     Status: Abnormal   Collection Time: 05/19/18  6:34 AM  Result Value Ref Range   Alcohol, Ethyl (B) 100 (H) <10 mg/dL    Comment: (NOTE) Lowest detectable limit for serum alcohol is 10 mg/dL. For medical purposes only. Performed at [facility], [address], [place], [place] [address]   Salicylate level     Status: None   Collection Time: 05/19/18  6:34 AM  Result Value Ref Range   Salicylate Lvl <7.0 2.8 - 30.0 mg/dL    Comment: Performed at [facility], [address], [place], [place] [address]  Acetaminophen level     Status: Abnormal   Collection Time: 05/19/18  6:34 AM  Result Value Ref Range   Acetaminophen (Tylenol), Serum <10 (L) 10 - 30 ug/mL    Comment: (NOTE) Therapeutic concentrations vary  significantly. A range of 10-30 ug/mL  may be an effective concentration for many patients. However, some  are best treated at concentrations outside of this range. Acetaminophen concentrations >150 ug/mL at 4 hours after ingestion  and >50 ug/mL at 12 hours after ingestion are often associated with  toxic reactions. Performed at [facility], [address] [place], [place] [address]   cbc     Status: None   Collection Time: 05/19/18  6:34 AM  Result Value Ref Range   WBC 8.2 4.0 - 10.5 K/uL   RBC 4.47 3.87 - 5.11 MIL/uL   Hemoglobin 13.0 12.0 - 15.0 g/dL   HCT 07.3 71.0 - 62.6 %   MCV 90.2 80.0 - 100.0 fL   MCH 29.1 26.0 - 34.0 pg   MCHC 32.3 30.0 - 36.0 g/dL   RDW 94.8 54.6 - 27.0 %   Platelets 284 150 - 400 K/uL   nRBC 0.0 0.0 - 0.2 %    Comment: Performed at [facility], [address], [place], [place] [address]  I-Stat beta hCG blood, ED     Status: None   Collection Time: 05/19/18  6:40 AM  Result Value Ref Range   I-stat hCG, quantitative <5.0 <5 mIU/mL   Comment 3            Comment:   GEST. AGE      CONC.  (mIU/mL)   <=1 WEEK        5 - 50     2 WEEKS       50 - 500     3 WEEKS       100 - 10,000  4 WEEKS     1,000 - 30,000        FEMALE AND NON-PREGNANT FEMALE:     LESS THAN 5 mIU/mL     Blood Alcohol level:  Lab Results  Component Value Date   ETH 100 (H) 05/19/2018   ETH 128 (H) 05/16/2018    Metabolic Disorder Labs:  Lab Results  Component Value Date   HGBA1C 5.2 02/25/2017   MPG 102.54 02/25/2017   MPG 108 05/31/2016   Lab Results  Component Value Date   PROLACTIN 31.2 (H) 02/25/2017   PROLACTIN 20.5 05/31/2016   Lab Results  Component Value Date   CHOL 166 02/25/2017   TRIG 100 02/25/2017   HDL 50 02/25/2017   CHOLHDL 3.3 02/25/2017   VLDL 20 02/25/2017   LDLCALC 96 02/25/2017   LDLCALC 66 05/31/2016    Current Medications: Current Facility-Administered Medications  Medication Dose Route  Frequency Provider Last Rate Last Dose  . alum & mag hydroxide-simeth (MAALOX/MYLANTA) 200-200-20 MG/5ML suspension 30 mL  30 mL Oral Q4H PRN Rosario AdieStarkes-Perry, Juel Burrowakia S, FNP      . feeding supplement (ENSURE ENLIVE) (ENSURE ENLIVE) liquid 237 mL  237 mL Oral BID BM Simon, Spencer E, PA-C      . hydrOXYzine (ATARAX/VISTARIL) tablet 25 mg  25 mg Oral TID PRN Maryagnes AmosStarkes-Perry, Takia S, FNP      . Influenza vac split quadrivalent PF (FLUARIX) injection 0.5 mL  0.5 mL Intramuscular Tomorrow-1000 Simon, Spencer E, PA-C      . magnesium hydroxide (MILK OF MAGNESIA) suspension 30 mL  30 mL Oral Daily PRN Maryagnes AmosStarkes-Perry, Takia S, FNP      . pneumococcal 23 valent vaccine (PNU-IMMUNE) injection 0.5 mL  0.5 mL Intramuscular Tomorrow-1000 Simon, Spencer E, PA-C      . QUEtiapine (SEROQUEL) tablet 50 mg  50 mg Oral TID Malvin JohnsFarah, Erroll Wilbourne, MD      . traZODone (DESYREL) tablet 100 mg  100 mg Oral QHS PRN Maryagnes AmosStarkes-Perry, Takia S, FNP       PTA Medications: Medications Prior to Admission  Medication Sig Dispense Refill Last Dose  . ARIPiprazole ER 400 MG SRER Inject 400 mg into the muscle every 28 (twenty-eight) days. For mood stability Next injection due 03/26/17 (Patient not taking: Reported on 05/19/2018) 1 each 0 Not Taking at Unknown time  . norgestimate-ethinyl estradiol (ORTHO-CYCLEN,SPRINTEC,PREVIFEM) 0.25-35 MG-MCG tablet Take 1 tablet by mouth daily. (Patient not taking: Reported on 05/19/2018) 1 Package 11 Not Taking at Unknown time    Musculoskeletal: Strength & Muscle Tone nl Gait & Station: normal Patient leans: N/A  Psychiatric Specialty Exam: Physical Examrefused  ROSrefused  Blood pressure 131/86, pulse (!) 58, temperature 98.7 F (37.1 C), temperature source Oral, resp. rate 16, height 5\' 3"  (1.6 m), weight 58.1 kg, SpO2 98 %.Body mass index is 22.67 kg/m.  General Appearance: Casual and Disheveled  Eye Contact:  None  Speech:  Slow  Volume:  Decreased  Mood:  Angry and Irritable  Affect:   Constricted  Thought Process:  Linear  Orientation:  Full (Time, Place, and Person)  Thought Content:  Logical  Suicidal Thoughts:  No  Homicidal Thoughts:  No  Memory:  Immediate;   Poor  Judgement:  Impaired  Insight:  Shallow  Psychomotor Activity:  Decreased  Concentration:  Attention Span: Poor  Recall:  Poor  Fund of Knowledge:  Poor  Language:  Negative  Akathisia:  Negative  Handed:  Right  AIMS (if indicated):     Assets:  Leisure Time  ADL's:  Intact  Cognition:  WNL  Sleep:  Number of Hours: 6.25    Treatment Plan Summary: Daily contact with patient to assess and evaluate symptoms and progress in treatment, Medication management and Plan Plan is to monitor overnight for withdrawal or dangerous behaviors and release in the morning as patient acknowledges homelessness as the primary issue  Observation Level/Precautions:  15 minute checks  Laboratory:  HCG  Psychotherapy:    Medications:    Consultations:    Discharge Concerns:    Estimated LOS:  Other:     Physician Treatment Plan for Primary Diagnosis: <principal problem not specified> Long Term Goal(s): Improvement in symptoms so as ready for discharge  Short Term Goals: Ability to identify changes in lifestyle to reduce recurrence of condition will improve, Ability to disclose and discuss suicidal ideas and Ability to demonstrate self-control will improve  Physician Treatment Plan for Secondary Diagnosis: Active Problems:   MDD (major depressive disorder), recurrent episode, severe (HCC)  Long Term Goal(s): Improvement in symptoms so as ready for discharge  Short Term Goals: Ability to identify and develop effective coping behaviors will improve and Ability to maintain clinical measurements within normal limits will improve  I certify that inpatient services furnished can reasonably be expected to improve the patient's condition.    Malvin Johns, MD 1/8/202011:32 AM

## 2018-05-20 NOTE — Tx Team (Signed)
Interdisciplinary Treatment and Diagnostic Plan Update  05/20/2018 Time of Session: [place] MRN: [id number]  Principal Diagnosis: MDD, recurrent, severe  Secondary Diagnoses: Active Problems:   MDD (major depressive disorder), recurrent episode, severe (HCC)   Current Medications:  Current Facility-Administered Medications  Medication Dose Route Frequency Provider Last Rate Last Dose  . alum & mag hydroxide-simeth (MAALOX/MYLANTA) 200-200-20 MG/5ML suspension 30 mL  30 mL Oral Q4H PRN Burt Ek, Gayland Curry, FNP      . feeding supplement (ENSURE ENLIVE) (ENSURE ENLIVE) liquid 237 mL  237 mL Oral BID BM Simon, Spencer E, PA-C      . hydrOXYzine (ATARAX/VISTARIL) tablet 25 mg  25 mg Oral TID PRN Suella Broad, FNP      . Influenza vac split quadrivalent PF (FLUARIX) injection 0.5 mL  0.5 mL Intramuscular Tomorrow-1000 Simon, Spencer E, PA-C      . magnesium hydroxide (MILK OF MAGNESIA) suspension 30 mL  30 mL Oral Daily PRN Starkes-Perry, Gayland Curry, FNP      . pneumococcal 23 valent vaccine (PNU-IMMUNE) injection 0.5 mL  0.5 mL Intramuscular Tomorrow-1000 Simon, Spencer E, PA-C      . traZODone (DESYREL) tablet 100 mg  100 mg Oral QHS PRN Suella Broad, FNP       PTA Medications: Medications Prior to Admission  Medication Sig Dispense Refill Last Dose  . ARIPiprazole ER 400 MG SRER Inject 400 mg into the muscle every 28 (twenty-eight) days. For mood stability Next injection due 03/26/17 (Patient not taking: Reported on 05/19/2018) 1 each 0 Not Taking at Unknown time  . norgestimate-ethinyl estradiol (ORTHO-CYCLEN,SPRINTEC,PREVIFEM) 0.25-35 MG-MCG tablet Take 1 tablet by mouth daily. (Patient not taking: Reported on 05/19/2018) 1 Package 11 Not Taking at Unknown time    Patient Stressors:    Patient Strengths:    Treatment Modalities: Medication Management, Group therapy, Case management,  1 to 1 session with clinician, Psychoeducation, Recreational  therapy.   Physician Treatment Plan for Primary Diagnosis: MDD, recurrent, severe  Medication Management: Evaluate patient's response, side effects, and tolerance of medication regimen.  Therapeutic Interventions: 1 to 1 sessions, Unit Group sessions and Medication administration.  Evaluation of Outcomes: Not Met  Physician Treatment Plan for Secondary Diagnosis: Active Problems:   MDD (major depressive disorder), recurrent episode, severe (HCC)   Medication Management: Evaluate patient's response, side effects, and tolerance of medication regimen.  Therapeutic Interventions: 1 to 1 sessions, Unit Group sessions and Medication administration.  Evaluation of Outcomes: Not Met   RN Treatment Plan for Primary Diagnosis: MDD, recurrent, severe  Long Term Goal(s): Knowledge of disease and therapeutic regimen to maintain health will improve  Short Term Goals: Ability to remain free from injury will improve, Ability to verbalize frustration and anger appropriately will improve, Ability to disclose and discuss suicidal ideas and Ability to identify and develop effective coping behaviors will improve  Medication Management: RN will administer medications as ordered by provider, will assess and evaluate patient's response and provide education to patient for prescribed medication. RN will report any adverse and/or side effects to prescribing provider.  Therapeutic Interventions: 1 on 1 counseling sessions, Psychoeducation, Medication administration, Evaluate responses to treatment, Monitor vital signs and CBGs as ordered, Perform/monitor CIWA, COWS, AIMS and Fall Risk screenings as ordered, Perform wound care treatments as ordered.  Evaluation of Outcomes: Not Met   LCSW Treatment Plan for Primary Diagnosis: MDD, recurrent, severe  Long Term Goal(s): Safe transition to appropriate next level of care at discharge, Engage patient  in therapeutic group addressing interpersonal  concerns.  Short Term Goals: Engage patient in aftercare planning with referrals and resources, Facilitate patient progression through stages of change regarding substance use diagnoses and concerns and Identify triggers associated with mental health/substance abuse issues  Therapeutic Interventions: Assess for all discharge needs, 1 to 1 time with Social worker, Explore available resources and support systems, Assess for adequacy in community support network, Educate family and significant other(s) on suicide prevention, Complete Psychosocial Assessment, Interpersonal group therapy.  Evaluation of Outcomes: Not Met   Progress in Treatment: Attending groups: No. New to unit. Continuing to assess.  Participating in groups: No. Taking medication as prescribed: Yes. Toleration medication: Yes. Family/Significant other contact made: No, will contact:  family member if pt consents to collateral contact.  Patient understands diagnosis: Yes. Discussing patient identified problems/goals with staff: Yes. Medical problems stabilized or resolved: Yes. Denies suicidal/homicidal ideation: Yes. Issues/concerns per patient self-inventory: No. Other: n/a   New problem(s) identified: No, Describe:  n/a  New Short Term/Long Term Goal(s): detox, medication management for mood stabilization; elimination of SI thoughts; development of comprehensive mental wellness/sobriety plan.   Patient Goals:  "to figure out what to do from here. Get back on meds."   Discharge Plan or Barriers: CSW assessing for appropriate referrals. Pt is homeless and was recently released from jail with upcoming court date. [facility] for outpatient mental health. [place] pamphlet, Mobile Crisis information, and AA/NA information provided to patient for additional community support and resources.   Reason for Continuation of Hospitalization: Anxiety Depression Medication stabilization Suicidal ideation Withdrawal symptoms  Estimated  Length of Stay: Friday, 05/22/2018  Attendees: Patient: "Kerri Parks 05/20/2018 11:25 AM  Physician: Dr. Jake Samples MD 05/20/2018 11:25 AM  Nursing: Arlyss Repress RN; Alyssa RN 05/20/2018 11:25 AM  RN Care Manager:x 05/20/2018 11:25 AM  Social Worker: Janice Norrie LCSW 05/20/2018 11:25 AM  Recreational Therapist: x 05/20/2018 11:25 AM  Other: Lindell Spar NP; Harriett Sine NP 05/20/2018 11:25 AM  Other:  05/20/2018 11:25 AM  Other: 05/20/2018 11:25 AM    Scribe for Treatment Team: Avelina Laine, LCSW 05/20/2018 11:25 AM

## 2018-05-20 NOTE — [facility] Suicide Risk Assessment (Signed)
[facility] Admission Suicide Risk Assessment   Nursing information obtained from:  Patient Demographic factors:  Gay, lesbian, or bisexual orientation Current Mental Status:  Suicidal ideation indicated by patient Loss Factors:  NA Historical Factors:  Prior suicide attempts Risk Reduction Factors:  NA  Total Time spent with patient: 30 minutes Principal Problem: Substance abuse/malingering Diagnosis:  Active Problems:   MDD (major depressive disorder), recurrent episode, severe (HCC)  Subjective Data: Patient acknowledging coming to the hospital because having "nowhere to go"  Continued Clinical Symptoms:  Alcohol Use Disorder Identification Test Final Score (AUDIT): 13 The "Alcohol Use Disorders Identification Test", Guidelines for Use in Primary Care, Second Edition.  World Science writer [facility]). Score between 0-7:  no or low risk or alcohol related problems. Score between 8-15:  moderate risk of alcohol related problems. Score between 16-19:  high risk of alcohol related problems. Score 20 or above:  warrants further diagnostic evaluation for alcohol dependence and treatment.   CLINICAL FACTORS:   Personality Disorders:   Cluster B  COGNITIVE FEATURES THAT CONTRIBUTE TO RISK:  Polarized thinking    SUICIDE RISK:   Mild:  Suicidal ideation of limited frequency, intensity, duration, and specificity.  There are no identifiable plans, no associated intent, mild dysphoria and related symptoms, good self-control (both objective and subjective assessment), few other risk factors, and identifiable protective factors, including available and accessible social support.  PLAN OF CARE: Monitor overnight to ensure no dangerous behaviors no withdrawal symptoms, then discharge, patient openly acknowledges malingering  I certify that inpatient services furnished can reasonably be expected to improve the patient's condition.   Malvin Johns, MD 05/20/2018, 11:30 AM

## 2018-05-21 MED ORDER — FLUOXETINE HCL 20 MG PO CAPS
20.0000 mg | ORAL_CAPSULE | Freq: Every day | ORAL | Status: DC
  Filled 2018-05-21 (×4): qty 1

## 2018-05-21 NOTE — Progress Notes (Signed)
Discharge Note:  Patient discharged with discharge papers, belongings sheet inadvertently given to patient by mistake.  Patient was yelling and cussing staff, calling staff bad names.  AC, nurse supervisor, RNs, MHTs, MD had talked to patient to calm down [place].  Patient was observed by security officer who assisted walking patient out of the [facility] building.

## 2018-05-21 NOTE — Discharge Summary (Addendum)
Physician Discharge Summary Note  Patient:  Kerri Parks is an 23 y.o., adult MRN:  [id number] DOB:  03/23/1996 Patient phone:  [phone] (home)  Patient address:   [address] [place] [id number],  Total Time spent with patient: 30 minutes  Date of Admission:  05/19/2018 Date of Discharge: 05/21/18  Reason for Admission: Homelessness/substance abuse HPI: This 23 year old transgender individual acknowledges being released from prison and having "nowhere to go" so coming to the hospital but was in a state of impairment therefore was admitted blood alcohol level was 100, drug screen reflected abuse of cocaine and cannabis.  Patient is poorly cooperative the interview process stating they just want to sleep and refusing to cooperate with full evaluation other than basic mental status exam.  Patient irritable poorly cooperative and acknowledging coming to the hospital for housing purposes.  Principal Problem: Severe personality disorder Discharge Diagnoses: Active Problems:   MDD (major depressive disorder), recurrent episode, severe (HCC)   Past Medical History:  Past Medical History:  Diagnosis Date  . Anemia   . Anxiety   . Asthma   . Bipolar 1 disorder (HCC)   . Conduct disorder   . Depression   . Hypothyroidism 08/05/2013   Per pt report  . Oppositional defiant behavior   . Overdose 03/07/2016   intential     Past Surgical History:  Procedure Laterality Date  . UPPER GI ENDOSCOPY     Family History:  Family History  Family history unknown: Yes   Social History:  Social History   Substance and Sexual Activity  Alcohol Use Yes  . Alcohol/week: 5.0 standard drinks  . Types: 5 Cans of beer per week   Comment: in the past     Social History   Substance and Sexual Activity  Drug Use Yes  . Types: Marijuana, Cocaine   Comment: "whenever she can get it"    Social History   Socioeconomic History  . Marital status: Single    Spouse name: Not on file   . Number of children: Not on file  . Years of education: Not on file  . Highest education level: Not on file  Occupational History  . Not on file  Social Needs  . Financial resource strain: Not on file  . Food insecurity:    Worry: Not on file    Inability: Not on file  . Transportation needs:    Medical: Not on file    Non-medical: Not on file  Tobacco Use  . Smoking status: Current Every Day Smoker    Packs/day: 0.25    Years: 2.00    Pack years: 0.50    Types: Cigarettes  . Smokeless tobacco: Never Used  Substance and Sexual Activity  . Alcohol use: Yes    Alcohol/week: 5.0 standard drinks    Types: 5 Cans of beer per week    Comment: in the past  . Drug use: Yes    Types: Marijuana, Cocaine    Comment: "whenever she can get it"  . Sexual activity: Not Currently  Lifestyle  . Physical activity:    Days per week: Not on file    Minutes per session: Not on file  . Stress: Not on file  Relationships  . Social connections:    Talks on phone: Not on file    Gets together: Not on file    Attends religious service: Not on file    Active member of club or organization: Not on file  Attends meetings of clubs or organizations: Not on file    Relationship status: Not on file  Other Topics Concern  . Not on file  Social History Narrative  . Not on file    Hospital Course:   While here the patient was refusing to cooperate for the most part, social work tried very hard to work with the patient to find rehab/housing and we had a plan in place that might of come through on the 10th however the patient was so consistently verbally abusive to the staff calling us insults and refusing to cooperate and not participating in the program meaningfully that we felt it was best to go and discharge the patient because it was so disruptive to the milieu and the other patients and further staff was feeling unsafe.  It should be noted that these behaviors were not driven by any bipolar  symptomatology or psychosis but rather a personality disorder.  Further should be noted on mental status exam on the date of the ninth the patient was alert irritable oriented to person place situation argumentative but denied suicidal thoughts plans or intent denied homicidal thoughts plans or intent denied cravings tremors or withdrawal symptoms on my evaluation therefore there is no acute dangerousness, patient refusing care, abusive to staff, and is discharged with information regarding shelters  Physical Findings: AIMS: Facial and Oral Movements Muscles of Facial Expression: None, normal Lips and Perioral Area: None, normal Jaw: None, normal Tongue: None, normal,Extremity Movements Upper (arms, wrists, hands, fingers): None, normal Lower (legs, knees, ankles, toes): None, normal, Trunk Movements Neck, shoulders, hips: None, normal, Overall Severity Severity of abnormal movements (highest score from questions above): None, normal Incapacitation due to abnormal movements: None, normal Patient's awareness of abnormal movements (rate only patient's report): No Awareness, Dental Status Current problems with teeth and/or dentures?: No Does patient usually wear dentures?: No  CIWA:  CIWA-Ar Total: 7 COWS:     Musculoskeletal: Strength & Muscle Tone: within normal limits Gait & Station: normal Patient leans: N/A Psychiatric Specialty Exam: Physical Exam  ROS  Blood pressure 131/86, pulse (!) 58, temperature 98.7 F (37.1 C), temperature source Oral, resp. rate 16, height 5\' 3"  (1.6 m), weight 58.1 kg, SpO2 98 %.Body mass index is 22.67 kg/m.  General Appearance: Casual  Eye Contact:  None  Speech:  Slow  Volume:  Decreased  Mood:  Irritable  Affect:  Congruent  Thought Process:  Goal Directed  Orientation:  Full (Time, Place, and Person)  Thought Content:  Tangential  Suicidal Thoughts:  No  Homicidal Thoughts:  No  Memory:  Immediate;   Fair  Judgement:  Fair  Insight:  Fair   Psychomotor Activity:  Decreased  Concentration:  Concentration: Fair  Recall:  Fair  Fund of Knowledge:  Fair  Language:  Good  Akathisia:  Negative  Handed:  Right  AIMS (if indicated):     Assets:  Leisure Time Physical Health  ADL's:  Intact  Cognition:  WNL  Sleep:  Number of Hours: 6.25           Has this patient used any form of tobacco in the last 30 days? (Cigarettes, Smokeless Tobacco, Cigars, and/or Pipes) Yes, No  Blood Alcohol level:  Lab Results  Component Value Date   ETH 100 (H) 05/19/2018   ETH 128 (H) 05/16/2018    Metabolic Disorder Labs:  Lab Results  Component Value Date   HGBA1C 5.2 02/25/2017   MPG 102.54 02/25/2017   MPG 108  05/31/2016   Lab Results  Component Value Date   PROLACTIN 31.2 (H) 02/25/2017   PROLACTIN 20.5 05/31/2016   Lab Results  Component Value Date   CHOL 166 02/25/2017   TRIG 100 02/25/2017   HDL 50 02/25/2017   CHOLHDL 3.3 02/25/2017   VLDL 20 02/25/2017   LDLCALC 96 02/25/2017   LDLCALC 66 05/31/2016    See Psychiatric Specialty Exam and Suicide Risk Assessment completed by Attending Physician prior to discharge.  Discharge destination:  Home  Is patient on multiple antipsychotic therapies at discharge:  No   Has Patient had three or more failed trials of antipsychotic monotherapy by history:  No  Recommended Plan for Multiple Antipsychotic Therapies: NA    Follow-up Information    [facility] Follow up.   Why:  Please contact Barbara in intake (ACT TEAM) once you [place] Medicaid to [place]. Thank you.  Contact information: [address] [place] [address] [phone]        [facility]. Go on 05/25/2018.   Specialty:  Behavioral Health Why:  Your hospital follow up appointment is Monday, 1/13 at 8:00a.  Please bring: photo ID, proof of insurance, social security card, and any discharge paperwork from this hospitalization. Contact informationElpidio Eric: [address] [place] [address] [phone]           Follow-up recommendations:  Activity:  full  Comments: No medications at discharge/greatest risk is manipulative gesture to obtain housing  Signed: Malvin JohnsFARAH,Allean Montfort, MD 05/21/2018, 10:24 AM

## 2018-05-21 NOTE — Progress Notes (Signed)
Patient reluctantly took morning medicine.  "get out of my room, shut the door."  Nurse attempted to given prozac ordered by MD. "GET OUT OF MY ROOM, LEAVE ME ALONE, I'M NOT TAKING ANY MEDICINES.  WHY DO YOU KEEP BOTHERING ME.  SHUT THE DOOR, DON'T COME BACK." Nurse informed RN and MHT on [address] hall to beware of this patient.

## 2018-05-21 NOTE — Progress Notes (Signed)
Patient has not cooperated with staff this morning.  Will not answer questions from staff.  Will not take prozac ordered by MD. Safety maintained with 15 minute checks.

## 2018-05-21 NOTE — Progress Notes (Signed)
Patient ID: Kerri Parks, adult   DOB: 05/30/1995, 23 y.o.   MRN: [id number]   D: Sleeping after change of shift. Got up briefly tonight. Reports mood still depressed and has SI on and off while awake. Patient irritable and no indepth conversations tonight. Took trazodone for sleep and got an Ensure. A: Staff will monitor on q 15 minute checks, follow treatment plan, and give medications as ordered. R: Cooperative on the unit.

## 2018-05-21 NOTE — Progress Notes (Signed)
[facility]  05/21/2018 9:43 AM Kerri Parks  MRN:  [id number] Subjective:    Patient still somewhat irritable but little more cooperative did sleep, antidepressant added, probable discharge tomorrow hopefully will find some type of meaningful rehab and/or housing by then   Principal Problem: Depressive symptoms/ substance abuse/ housing issues/ secondary gain issues-personality disorder Diagnosis: Active Problems:   MDD (major depressive disorder), recurrent episode, severe (HCC)  Total Time spent with patient: 15 minutes   Past Medical History:  Past Medical History:  Diagnosis Date  . Anemia   . Anxiety   . Asthma   . Bipolar 1 disorder (HCC)   . Conduct disorder   . Depression   . Hypothyroidism 08/05/2013   Per pt report  . Oppositional defiant behavior   . Overdose 03/07/2016   intential     Past Surgical History:  Procedure Laterality Date  . UPPER GI ENDOSCOPY     Family History:  Family History  Family history unknown: Yes    Social History:  Social History   Substance and Sexual Activity  Alcohol Use Yes  . Alcohol/week: 5.0 standard drinks  . Types: 5 Cans of beer per week   Comment: in the past     Social History   Substance and Sexual Activity  Drug Use Yes  . Types: Marijuana, Cocaine   Comment: "whenever she can get it"    Social History   Socioeconomic History  . Marital status: Single    Spouse name: Not on file  . Number of children: Not on file  . Years of education: Not on file  . Highest education level: Not on file  Occupational History  . Not on file  Social Needs  . Financial resource strain: Not on file  . Food insecurity:    Worry: Not on file    Inability: Not on file  . Transportation needs:    Medical: Not on file    Non-medical: Not on file  Tobacco Use  . Smoking status: Current Every Day Smoker    Packs/day: 0.25    Years: 2.00    Pack years: 0.50    Types: Cigarettes  . Smokeless tobacco: Never  Used  Substance and Sexual Activity  . Alcohol use: Yes    Alcohol/week: 5.0 standard drinks    Types: 5 Cans of beer per week    Comment: in the past  . Drug use: Yes    Types: Marijuana, Cocaine    Comment: "whenever she can get it"  . Sexual activity: Not Currently  Lifestyle  . Physical activity:    Days per week: Not on file    Minutes per session: Not on file  . Stress: Not on file  Relationships  . Social connections:    Talks on phone: Not on file    Gets together: Not on file    Attends religious service: Not on file    Active member of club or organization: Not on file    Attends meetings of clubs or organizations: Not on file    Relationship status: Not on file  Other Topics Concern  . Not on file  Social History Narrative  . Not on file   Additional Social History:                         Sleep: Good  Appetite:  Good  Current Medications: Current Facility-Administered Medications  Medication Dose Route Frequency Provider  Last Rate Last Dose  . alum & mag hydroxide-simeth (MAALOX/MYLANTA) 200-200-20 MG/5ML suspension 30 mL  30 mL Oral Q4H PRN Starkes-Perry, Juel Burrowakia S, FNP      . colloidal oatmeal (AVEENO) medicated soap bar   Topical Daily PRN Malvin JohnsFarah, Leslee Suire, MD      . diphenhydrAMINE (BENADRYL) capsule 50 mg  50 mg Oral Q4H PRN Malvin JohnsFarah, Yaa Donnellan, MD   50 mg at 05/20/18 1836  . feeding supplement (ENSURE ENLIVE) (ENSURE ENLIVE) liquid 237 mL  237 mL Oral BID BM Simon, Spencer E, PA-C   237 mL at 05/20/18 1431  . FLUoxetine (PROZAC) capsule 20 mg  20 mg Oral Daily Malvin JohnsFarah, Jeilani Grupe, MD      . hydrOXYzine (ATARAX/VISTARIL) tablet 25 mg  25 mg Oral TID PRN Maryagnes AmosStarkes-Perry, Takia S, FNP      . magnesium hydroxide (MILK OF MAGNESIA) suspension 30 mL  30 mL Oral Daily PRN Rosario AdieStarkes-Perry, Juel Burrowakia S, FNP      . QUEtiapine (SEROQUEL) tablet 50 mg  50 mg Oral TID Malvin JohnsFarah, Ayyan Sites, MD   50 mg at 05/21/18 [id number]  . traZODone (DESYREL) tablet 100 mg  100 mg Oral QHS PRN Maryagnes AmosStarkes-Perry,  Takia S, FNP   100 mg at 05/20/18 2220    Lab Results: No results found for this or any previous visit (from the past 48 hour(s)).  Blood Alcohol level:  Lab Results  Component Value Date   ETH 100 (H) 05/19/2018   ETH 128 (H) 05/16/2018    Metabolic Disorder Labs: Lab Results  Component Value Date   HGBA1C 5.2 02/25/2017   MPG 102.54 02/25/2017   MPG 108 05/31/2016   Lab Results  Component Value Date   PROLACTIN 31.2 (H) 02/25/2017   PROLACTIN 20.5 05/31/2016   Lab Results  Component Value Date   CHOL 166 02/25/2017   TRIG 100 02/25/2017   HDL 50 02/25/2017   CHOLHDL 3.3 02/25/2017   VLDL 20 02/25/2017   LDLCALC 96 02/25/2017   LDLCALC 66 05/31/2016    Physical Findings: AIMS:  , ,  ,  ,    CIWA:    COWS:     Musculoskeletal: Strength & Muscle Tone: within normal limits Gait & Station: normal Patient leans: N/A  Psychiatric Specialty Exam: Physical Exam  ROS  Blood pressure 131/86, pulse (!) 58, temperature 98.7 F (37.1 C), temperature source Oral, resp. rate 16, height 5\' 3"  (1.6 m), weight 58.1 kg, SpO2 98 %.Body mass index is 22.67 kg/m.  General Appearance: Casual  Eye Contact:  None  Speech:  Slow  Volume:  Decreased  Mood:  Irritable  Affect:  Congruent  Thought Process:  Goal Directed  Orientation:  Full (Time, Place, and Person)  Thought Content:  Tangential  Suicidal Thoughts:  No  Homicidal Thoughts:  No  Memory:  Immediate;   Fair  Judgement:  Fair  Insight:  Fair  Psychomotor Activity:  Decreased  Concentration:  Concentration: Fair  Recall:  [employer]Fair  Fund of Knowledge:  Fair  Language:  Good  Akathisia:  Negative  Handed:  Right  AIMS (if indicated):     Assets:  Leisure Time Physical Health  ADL's:  Intact  Cognition:  WNL  Sleep:  Number of Hours: 6.25     Treatment Plan Summary: Daily contact with patient to assess and evaluate symptoms and progress in treatment, Medication management and Plan Medications adjusted add  antidepressant continue cognitive therapy even though patient poorly cooperative will try to engage probable discharge tomorrow  Malvin Johns, MD 05/21/2018, 9:43 AM

## 2018-05-21 NOTE — [facility] Suicide Risk Assessment (Signed)
[facility] Discharge Suicide Risk Assessment   Principal Problem: Malingering Discharge Diagnoses: Active Problems:   MDD (major depressive disorder), recurrent episode, severe (HCC)   Total Time spent with patient: 30 minutes  Patient remains verbally abusive to staff poorly cooperative and irritable, staff feels unsafe at times due to the abusiveness, further there is no suicidal thinking no homicidal thinking and patient is patently calling all of staff "assholes" Mental Status Per Nursing Assessment::   On Admission:  Suicidal ideation indicated by patient  Demographic Factors:  Gay, lesbian, or bisexual orientation  Loss Factors: Financial problems/change in socioeconomic status  Historical Factors: Impulsivity  Risk Reduction Factors:   NA  Continued Clinical Symptoms:  Alcohol/Substance Abuse/Dependencies  Cognitive Features That Contribute To Risk:  Closed-mindedness    Suicide Risk:  Moderate:  Frequent suicidal ideation with limited intensity, and duration, some specificity in terms of plans, no associated intent, good self-control, limited dysphoria/symptomatology, some risk factors present, and identifiable protective factors, including available and accessible social support. Greatest risk is a gesture or attempt in order to obtain housing Follow-up Information    [facility] Follow up.   Why:  Please contact Barbara in intake (ACT TEAM) once you transfer Medicaid to [place]. Thank you.  Contact information: [address] [place] [address] [phone]        [facility]. Go on 05/25/2018.   Specialty:  Behavioral Health Why:  Your hospital follow up appointment is Monday, 1/13 at 8:00a.  Please bring: photo ID, proof of insurance, social security card, and any discharge paperwork from this hospitalization. Contact information: [address] [place] [address] [phone]           Plan Of Care/Follow-up  recommendations:  Activity:  full  Leinaala Catanese, MD 05/21/2018, 10:22 AM

## 2018-05-21 NOTE — Progress Notes (Signed)
  [facility] Adult Case Management Discharge Plan :  Will you be returning to the same living situation after discharge:  No. IRC information and shelter information provided.  At discharge, do you have transportation home?: Yes,  bus pass Do you have the ability to pay for your medications: Yes,  [employer] medicaid  Release of information consent forms completed and submitted to medical records by CSW.   Patient to Follow up at: Follow-up Information    [facility] Follow up.   Why:  Please contact Barbara in intake (ACT TEAM) once you transfer Medicaid [place]. Thank you.  Contact information: [address] [place] [address] [phone]        [facility]. Go on 05/25/2018.   Specialty:  Behavioral Health Why:  Your hospital follow up appointment is Monday, 1/13 at 8:00a.  Please bring: photo ID, proof of insurance, social security card, and any discharge paperwork from this hospitalization. Contact information: [address] [place] [address] [phone]           Next level of care provider has access to [facility] Link:no  Safety Planning and Suicide Prevention discussed: Yes,  SPE completed with pt; pt declined to consent to collateral contact. SPI pamphlet and mobile crisis information provided.     Has patient been referred to the Quitline?: Patient refused referral  Patient has been referred for addiction treatment: Yes  Rona Ravens, LCSW 05/21/2018, 11:49 AM

## 2018-05-23 ENCOUNTER — Emergency Department (HOSPITAL_COMMUNITY): Payer: Medicaid Other

## 2018-05-23 ENCOUNTER — Emergency Department (HOSPITAL_COMMUNITY)
Admission: EM | Admit: 2018-05-23 | Discharge: 2018-05-23 | Disposition: A | Discharge status: Home / Self Care | Payer: Medicaid Other | Source: Home / Self Care | Attending: Emergency Medicine | Admitting: Emergency Medicine

## 2018-05-23 ENCOUNTER — Emergency Department (EMERGENCY_DEPARTMENT_HOSPITAL)
Admission: EM | Admit: 2018-05-23 | Discharge: 2018-05-25 | Disposition: A | Discharge status: Other Acute Inpatient Hospital | Payer: Medicaid Other | Source: Home / Self Care | Attending: Emergency Medicine | Admitting: Emergency Medicine

## 2018-05-23 ENCOUNTER — Emergency Department (HOSPITAL_COMMUNITY)
Admission: EM | Admit: 2018-05-23 | Discharge: 2018-05-23 | Disposition: A | Discharge status: Home / Self Care | Payer: Medicaid Other | Attending: Emergency Medicine | Admitting: Emergency Medicine

## 2018-05-23 ENCOUNTER — Encounter (HOSPITAL_COMMUNITY): Payer: Self-pay | Admitting: Emergency Medicine

## 2018-05-23 ENCOUNTER — Encounter (HOSPITAL_COMMUNITY): Payer: Self-pay

## 2018-05-23 ENCOUNTER — Other Ambulatory Visit: Payer: Self-pay

## 2018-05-23 DIAGNOSIS — M79671 Pain in right foot: Secondary | ICD-10-CM | POA: Insufficient documentation

## 2018-05-23 DIAGNOSIS — Z79899 Other long term (current) drug therapy: Secondary | ICD-10-CM | POA: Insufficient documentation

## 2018-05-23 DIAGNOSIS — R45851 Suicidal ideations: Secondary | ICD-10-CM

## 2018-05-23 DIAGNOSIS — Z91010 Allergy to peanuts: Secondary | ICD-10-CM | POA: Insufficient documentation

## 2018-05-23 DIAGNOSIS — Z590 Homelessness: Secondary | ICD-10-CM

## 2018-05-23 DIAGNOSIS — F17210 Nicotine dependence, cigarettes, uncomplicated: Secondary | ICD-10-CM

## 2018-05-23 DIAGNOSIS — F1010 Alcohol abuse, uncomplicated: Secondary | ICD-10-CM | POA: Insufficient documentation

## 2018-05-23 DIAGNOSIS — Y999 Unspecified external cause status: Secondary | ICD-10-CM | POA: Insufficient documentation

## 2018-05-23 DIAGNOSIS — F322 Major depressive disorder, single episode, severe without psychotic features: Secondary | ICD-10-CM

## 2018-05-23 DIAGNOSIS — E039 Hypothyroidism, unspecified: Secondary | ICD-10-CM

## 2018-05-23 DIAGNOSIS — J45909 Unspecified asthma, uncomplicated: Secondary | ICD-10-CM

## 2018-05-23 DIAGNOSIS — Y939 Activity, unspecified: Secondary | ICD-10-CM | POA: Insufficient documentation

## 2018-05-23 DIAGNOSIS — Z5321 Procedure and treatment not carried out due to patient leaving prior to being seen by health care provider: Secondary | ICD-10-CM | POA: Diagnosis not present

## 2018-05-23 DIAGNOSIS — Y929 Unspecified place or not applicable: Secondary | ICD-10-CM | POA: Insufficient documentation

## 2018-05-23 DIAGNOSIS — W19XXXA Unspecified fall, initial encounter: Secondary | ICD-10-CM | POA: Insufficient documentation

## 2018-05-23 DIAGNOSIS — F1414 Cocaine abuse with cocaine-induced mood disorder: Secondary | ICD-10-CM

## 2018-05-23 DIAGNOSIS — S92351A Displaced fracture of fifth metatarsal bone, right foot, initial encounter for closed fracture: Secondary | ICD-10-CM

## 2018-05-23 DIAGNOSIS — F3131 Bipolar disorder, current episode depressed, mild: Secondary | ICD-10-CM | POA: Diagnosis present

## 2018-05-23 LAB — COMPREHENSIVE METABOLIC PANEL
ALT: 15 U/L (ref 0–44)
AST: 40 U/L (ref 15–41)
Albumin: 4.9 g/dL (ref 3.5–5.0)
Alkaline Phosphatase: 70 U/L (ref 38–126)
Anion gap: 10 (ref 5–15)
BUN: 13 mg/dL (ref 6–20)
CO2: 24 mmol/L (ref 22–32)
Calcium: 9.4 mg/dL (ref 8.9–10.3)
Chloride: 105 mmol/L (ref 98–111)
Creatinine, Ser: 0.65 mg/dL (ref 0.44–1.00)
GFR calc Af Amer: >60 mL/min (ref >60)
GFR calc non Af Amer: >60 mL/min (ref >60)
Glucose, Bld: 92 mg/dL (ref 70–99)
Potassium: 3.6 mmol/L (ref 3.5–5.1)
Sodium: 139 mmol/L (ref 135–145)
Total Bilirubin: 0.6 mg/dL (ref 0.3–1.2)
Total Protein: 8.3 g/dL — ABNORMAL HIGH (ref 6.5–8.1)

## 2018-05-23 LAB — ETHANOL: Alcohol, Ethyl (B): <10 mg/dL (ref <10)

## 2018-05-23 LAB — CBC WITH DIFFERENTIAL/PLATELET
Abs Immature Granulocytes: 0.07 10*3/uL (ref 0.00–0.07)
Basophils Absolute: 0.1 10*3/uL (ref 0.0–0.1)
Basophils Relative: 1 %
Eosinophils Absolute: 0.1 10*3/uL (ref 0.0–0.5)
Eosinophils Relative: 1 %
HCT: 41.6 % (ref 36.0–46.0)
Hemoglobin: 13.7 g/dL (ref 12.0–15.0)
Immature Granulocytes: 1 %
Lymphocytes Relative: 20 %
Lymphs Abs: 2.8 10*3/uL (ref 0.7–4.0)
MCH: 29.6 pg (ref 26.0–34.0)
MCHC: 32.9 g/dL (ref 30.0–36.0)
MCV: 89.8 fL (ref 80.0–100.0)
Monocytes Absolute: 0.9 10*3/uL (ref 0.1–1.0)
Monocytes Relative: 7 %
Neutro Abs: 9.7 10*3/uL — ABNORMAL HIGH (ref 1.7–7.7)
Neutrophils Relative %: 70 %
Platelets: 260 10*3/uL (ref 150–400)
RBC: 4.63 MIL/uL (ref 3.87–5.11)
RDW: 13.8 % (ref 11.5–15.5)
WBC: 13.6 10*3/uL — ABNORMAL HIGH (ref 4.0–10.5)
nRBC: 0 % (ref 0.0–0.2)

## 2018-05-23 LAB — I-STAT BETA HCG BLOOD, ED (MC, WL, AP ONLY): I-stat hCG, quantitative: <5 m[IU]/mL (ref <5)

## 2018-05-23 LAB — ACETAMINOPHEN LEVEL: Acetaminophen (Tylenol), Serum: <10 ug/mL — ABNORMAL LOW (ref 10–30)

## 2018-05-23 LAB — SALICYLATE LEVEL: Salicylate Lvl: <7 mg/dL (ref 2.8–30.0)

## 2018-05-23 MED ORDER — ACETAMINOPHEN 500 MG PO TABS
500.0000 mg | ORAL_TABLET | Freq: Four times a day (QID) | ORAL | Status: DC | PRN
  Administered 2018-05-23 – 2018-05-25 (×3): 500 mg via ORAL
  Filled 2018-05-23 (×3): qty 1

## 2018-05-23 MED ORDER — MORPHINE SULFATE (PF) 4 MG/ML IV SOLN
4.0000 mg | Freq: Once | INTRAVENOUS | Status: AC
  Administered 2018-05-23: 4 mg via INTRAVENOUS
  Filled 2018-05-23: qty 1

## 2018-05-23 MED ORDER — IBUPROFEN 200 MG PO TABS
600.0000 mg | ORAL_TABLET | Freq: Four times a day (QID) | ORAL | Status: DC | PRN
  Administered 2018-05-24 – 2018-05-25 (×3): 600 mg via ORAL
  Filled 2018-05-23 (×3): qty 3

## 2018-05-23 MED ORDER — IBUPROFEN 600 MG PO TABS: 600.0000 mg | ORAL_TABLET | Freq: Four times a day (QID) | ORAL | 0 refills | Status: DC | PRN

## 2018-05-23 MED ORDER — SODIUM CHLORIDE 0.9 % IV BOLUS
1000.0000 mL | Freq: Once | INTRAVENOUS | Status: AC
  Administered 2018-05-23: 1000 mL via INTRAVENOUS

## 2018-05-23 MED ORDER — ACETAMINOPHEN 500 MG PO TABS: 500.0000 mg | ORAL_TABLET | Freq: Four times a day (QID) | ORAL | 0 refills | Status: DC | PRN

## 2018-05-23 NOTE — ED Triage Notes (Signed)
Pt arrived via [facility]; pt from bus stop across from [facility] with c/o R foot pain; pt fell earlier while drinking and smoking crack; pt has swelling and bruising to lateral aspect of foot; 110 palpated BP, 120, 97% on RA, 16

## 2018-05-23 NOTE — ED Provider Notes (Signed)
[facility]-EMERGENCY DEPT Provider Note   CSN: [id number] Arrival date & time: 05/23/18  1858     History   Chief Complaint Chief Complaint  Patient presents with  . Suicidal    "Run in traffic"     HPI Kerri Parks is a 23 y.o. adult.  The history is provided by the patient and medical records. No language interpreter was used.   Kerri Parks is a 23 y.o. adult  with a PMH of bipolar disorder, anxiety, asthma, hypothyroidism, substance abuse who presents to the Emergency Department complaining of suicidal thoughts.  Patient states that she wants to kill herself.  She states "I know you will think I am motioning, but this is the last straw.  Now that I have broken my foot and I cannot walk, I just want to end it all."  She reports being homeless and has nowhere to rest.  This is all becoming too much for her to bear.  She denies any thoughts of hurting others or auditory/visual hallucinations.  Complains of the pain to her foot, but no other pain complaints.  No numbness or weakness.   Past Medical History:  Diagnosis Date  . Anemia   . Anxiety   . Asthma   . Bipolar 1 disorder (HCC)   . Conduct disorder   . Depression   . Hypothyroidism 08/05/2013   Per pt report  . Oppositional defiant behavior   . Overdose 03/07/2016   intential     Patient Active Problem List   Diagnosis Date Noted  . Cocaine abuse with cocaine-induced mood disorder (HCC) 05/23/2018  . Alcohol abuse 05/23/2018  . MDD (major depressive disorder), recurrent episode, severe (HCC) 05/19/2018  . Bipolar disorder (HCC) 02/23/2017  . Borderline personality disorder (HCC) 02/22/2017  . Intentional overdose of drug in tablet form (HCC) 02/21/2017  . Suicidal ideation   . Bipolar affective disorder, depressed, mild (HCC) 03/09/2016  . Overdose 03/07/2016  . Encounter for central line placement   . Lithium toxicity   . Prolonged QT interval   . Suicide attempt (HCC)   . Food  allergy 11/08/2015  . Foster child 11/08/2015  . Iron deficiency anemia 11/08/2015  . Asthma, chronic 08/19/2013    Past Surgical History:  Procedure Laterality Date  . UPPER GI ENDOSCOPY       OB History   No obstetric history on file.      Home Medications    Prior to Admission medications   Medication Sig Start Date End Date Taking? Authorizing Provider  acetaminophen (TYLENOL) 500 MG tablet Take 1 tablet (500 mg total) by mouth every 6 (six) hours as needed. 05/23/18   Fawze, Mina A, PA-C  ibuprofen (ADVIL,MOTRIN) 600 MG tablet Take 1 tablet (600 mg total) by mouth every 6 (six) hours as needed. 05/23/18   Fawze, Mina A, PA-C  LEVONORGESTREL-ETHINYL ESTRAD PO Take 1 tablet by mouth daily.  08/19/13  [provider]    Family History Family History  Family history unknown: Yes    Social History Social History   Tobacco Use  . Smoking status: Current Every Day Smoker    Packs/day: 0.25    Years: 2.00    Pack years: 0.50    Types: Cigarettes  . Smokeless tobacco: Never Used  Substance Use Topics  . Alcohol use: Yes    Alcohol/week: 5.0 standard drinks    Types: 5 Cans of beer per week    Comment: in the past  .  Drug use: Yes    Types: Marijuana, Cocaine    Comment: "whenever she can get it"     Allergies   Peanut-containing drug products; Penicillins; Amoxicillin; and Food   Review of Systems Review of Systems  Musculoskeletal: Positive for arthralgias.  Skin: Positive for color change (Ecchymosis to the right foot).  Psychiatric/Behavioral: Positive for suicidal ideas. Negative for hallucinations.  All other systems reviewed and are negative.    Physical Exam Updated Vital Signs BP 117/72 (BP Location: Right Arm)   Pulse 65   Temp 98.5 F (36.9 C) (Oral)   Resp 18   Ht 5\' 3"  (1.6 m)   Wt 56.7 kg   SpO2 99%   BMI 22.14 kg/m   Physical Exam Vitals signs and nursing note reviewed.  Constitutional:      General: He is not in acute  distress.    Appearance: He is well-developed.  HENT:     Head: Normocephalic and atraumatic.  Cardiovascular:     Rate and Rhythm: Normal rate and regular rhythm.     Heart sounds: Normal heart sounds. No murmur.  Pulmonary:     Effort: Pulmonary effort is normal. No respiratory distress.     Breath sounds: Normal breath sounds.  Abdominal:     General: There is no distension.     Palpations: Abdomen is soft.     Tenderness: There is no abdominal tenderness.  Skin:    General: Skin is warm and dry.     Comments: Lower extremities warm to the touch and perfusing well with good cap refill.  Neurological:     Mental Status: He is alert and oriented to person, place, and time.     Comments: Bilateral lower extremities are neurovascularly intact.      ED Treatments / Results  Labs (all labs ordered are listed, but only abnormal results are displayed) Labs Reviewed - No data to display  EKG None  Radiology Dg Ankle Complete Right  Result Date: 05/23/2018 CLINICAL DATA:  23 year old female with right ankle pain after recent fall EXAM: RIGHT ANKLE - COMPLETE 3+ VIEW COMPARISON:  None. FINDINGS: The ankle itself appears intact. There is no focal soft tissue swelling, evidence of fracture or malalignment. No ankle joint effusion. However, there is an incompletely imaged oblique fracture through the mid aspect of the fifth metatarsal. IMPRESSION: Partially imaged obliquely oriented fracture through the mid aspect of the fifth metatarsal. Recommend dedicated foot radiographs. No evidence of fracture, malalignment or joint effusion involving the ankle itself. Electronically Signed   By: Malachy MoanHeath  McCullough M.D.   On: 05/23/2018 08:31   Dg Foot Complete Right  Result Date: 05/23/2018 CLINICAL DATA:  Patient states he fell. Pain, bruising and swelling on the lateral aspect of the right foot. EXAM: RIGHT FOOT COMPLETE - 3+ VIEW COMPARISON:  05/12/2012 FINDINGS: Oblique fracture of the midshaft  fifth metatarsal, distal fracture fragment displaced medially 1-2 mm. No significant angulation. No intra-articular extension. No other bone abnormalities identified. IMPRESSION: Minimally displaced oblique fracture, midshaft fifth metatarsal. Electronically Signed   By: Corlis Leak  Hassell M.D.   On: 05/23/2018 09:08    Procedures Procedures (including critical care time)  Medications Ordered in ED Medications  acetaminophen (TYLENOL) tablet 500 mg (has no administration in time range)  ibuprofen (ADVIL,MOTRIN) tablet 600 mg (has no administration in time range)     Initial Impression / Assessment and Plan / ED Course  I have reviewed the triage vital signs and the nursing notes.  Pertinent labs & imaging results that were available during my care of the patient were reviewed by me and considered in my medical decision making (see chart for details).    Kerri Parks is a 23 y.o. adult who presents to ED for suicidal thoughts.  She was already seen earlier today where labs were obtained.  I have reviewed these and they are reassuring.  Do not feel that she needs repeat lab work again in the same day.  He has a known fracture of her fifth metatarsal bone.  Neurovascularly intact.  All compartments soft.  Tylenol and Motrin as needed for pain placed in orders.  Medically cleared with disposition per TTS recommendations.  Final Clinical Impressions(s) / ED Diagnoses   Final diagnoses:  Suicidal thoughts    ED Discharge Orders    None       Zadok Holaway, Chase PicketJaime Pilcher, PA-C 05/23/18 2012    Jacalyn LefevreHaviland, Julie, MD 05/24/18 [phone]

## 2018-05-23 NOTE — Progress Notes (Signed)
23 yo female who presented to the ED with foot pain after falling while using alcohol and cocaine.  She came to the ED for her foot then said she was tired of living on the streets.  She is irritable and reports [facility] because she was being rude.  Per notes, she was cursing at staff and refusing treatments including medications.  She was not psychotic so she was discharged with recommendation to follow up at [facility].  Today, she denies suicidal/homicidal ideations, hallucinations, and withdrawal symptoms.  Peer support consult placed and encouraged to follow up with [facility], stable for discharge.   Nanine MeansJamison Braelynne Garinger, PMHNP

## 2018-05-23 NOTE — ED Triage Notes (Signed)
Pt states, "I am just out on the streets doing drugs and I just don't about anything". Pt mumbled something uninterruptable when asked if has thoughts of wanting to harm themselves.

## 2018-05-23 NOTE — Progress Notes (Signed)
LCSW provided patient with taxi voucher vs bus pass. Patient has broken foot with boot and crutches and a large amount of belongings that she is traveling with.   Beulah Gandy [place] Long CSW [phone]

## 2018-05-23 NOTE — ED Notes (Signed)
Ortho Tech at bedside with patient. 

## 2018-05-23 NOTE — ED Notes (Signed)
Patient has been given Jeans from closet at [facility] before discharge and CSW will provide Cab Voucher for patient.

## 2018-05-23 NOTE — BH Assessment (Signed)
[facility] Assessment Progress Note   Clinician did inform Elizabeth SauerJaime Ward, PA of disposition.

## 2018-05-23 NOTE — ED Triage Notes (Signed)
Per [facility] pt was seen at [facility] ED and left AMA. [place] hurting right ankle. Pt admitted to ETOH and cocaine use.

## 2018-05-23 NOTE — ED Triage Notes (Signed)
Pt states, "I want to run in the traffic." Denies [facility]. Pt states, "I have not eaten all day and my foot is hurting." 8/10 pain right foot. Pt has a boot (medical device) on for support.   Clothing was put in pt belongings bag in locker 31.

## 2018-05-23 NOTE — ED Notes (Signed)
Patient has been provided with Bus pass and Nurse Secretary is assisting with finding pants that fit patient.

## 2018-05-23 NOTE — Progress Notes (Signed)
Orthopedic Tech Progress Note Patient Details:  Kerri Parks [birth year] [id number] Pt had cam walker on instead of short leg splint. Ortho Devices Type of Ortho Device: Crutches, CAM walker, Ace wrap Ortho Device/Splint Location: Rt leg Ortho Device/Splint Interventions: Application, Adjustment   Post Interventions Patient Tolerated: Well Instructions Provided: Adjustment of device, Care of device   Tawni Carnes [facility] 05/23/2018, 11:14 AM

## 2018-05-23 NOTE — Discharge Instructions (Signed)
1. Medications: Alternate 600 mg of ibuprofen and [phone] mg of Tylenol every 3 hours as needed for pain. Do not exceed 4000 mg of Tylenol daily.  Take ibuprofen with food to avoid upset stomach issues.  2. Treatment: rest, ice, elevate, use crutches to help you get around, keep the splint clean and dry.  Cover it with a bag when showering to make sure it does not get wet.  3. Follow Up: Please followup with orthopedics as directed or your PCP in 1 week if no improvement for discussion of your diagnoses and further evaluation after today's visit; if you do not have a primary care doctor use the resource guide provided to find one; Please return to the ER for worsening symptoms or other concerns such as worsening swelling, redness of the skin, fevers, loss of pulses, or loss of feeling

## 2018-05-23 NOTE — ED Notes (Signed)
Bed: WA09 Expected date:  Expected time:  Means of arrival:  Comments: Held for Psych

## 2018-05-23 NOTE — ED Notes (Signed)
Pt in restroom before NT could ask her to collect urine sample.

## 2018-05-23 NOTE — ED Notes (Signed)
Bed: WA31 Expected date:  Expected time:  Means of arrival:  Comments: 

## 2018-05-23 NOTE — ED Provider Notes (Signed)
  Face-to-face evaluation   History: Patient presents for suicidal ideation, without plan.  He has thought about jumping off a bridge or taking pills which he has done in the past.  He is homeless.  Physical exam: Alert, calm, cooperative.  Appears depressed.  Right foot tender and swollen laterally with ecchymosis.  No break in the skin.  No respiratory distress.  No internal responsiveness.  Medical screening examination/treatment/procedure(s) were conducted as a shared visit with non-physician practitioner(s) and myself.  I personally evaluated the patient during the encounter    Mancel Bale, MD 05/23/18 [phone]

## 2018-05-23 NOTE — [facility] Suicide Risk Assessment (Signed)
Suicide Risk Assessment  Discharge Assessment   [facility] Discharge Suicide Risk Assessment   Principal Problem: Cocaine abuse with cocaine-induced mood disorder [facility]) Discharge Diagnoses: Principal Problem:   Cocaine abuse with cocaine-induced mood disorder (HCC) Active Problems:   Bipolar affective disorder, depressed, mild (HCC)   Alcohol abuse   Total Time spent with patient: 45 minutes  Musculoskeletal: Strength & Muscle Tone: within normal limits Gait & Station: normal Patient leans: N/A  Psychiatric Specialty Exam:   Blood pressure 123/86, pulse 99, temperature 98.4 F (36.9 C), temperature source Oral, resp. rate 18, SpO2 98 %.There is no height or weight on file to calculate BMI.  General Appearance: Casual  Eye Contact::  Good  Speech:  Normal Rate409  Volume:  Normal  Mood:  Irritable  Affect:  Congruent  Thought Process:  Coherent and Descriptions of Associations: Intact  Orientation:  Full (Time, Place, and Person)  Thought Content:  WDL and Logical  Suicidal Thoughts:  No  Homicidal Thoughts:  No  Memory:  Immediate;   Good Recent;   Good Remote;   Good  Judgement:  Fair  Insight:  Fair  Psychomotor Activity:  Normal  Concentration:  Good  Recall:  Good  Fund of Knowledge:Fair  Language: Good  Akathisia:  No  Handed:  Right  AIMS (if indicated):     Assets:  Leisure Time Physical Health Resilience  Sleep:     Cognition: WNL  ADL's:  Intact   Mental Status Per Nursing Assessment::   On Admission:   23 yo female who presented to the ED with foot pain after falling while using alcohol and cocaine.  She came to the ED for her foot then said she was tired of living on the streets.  She is irritable and reports she was discharged from [facility] on the 7th because she was being rude.  Per notes, she was cursing at staff and refusing treatments including medications.  She was not psychotic so she was discharged with recommendation to follow up at [facility].   Today, she denies suicidal/homicidal ideations, hallucinations, and withdrawal symptoms.  Peer support consult placed and encouraged to follow up with [facility], stable for discharge.  Demographic Factors:  Adolescent or young adult and Caucasian  Loss Factors: NA  Historical Factors: NA  Risk Reduction Factors:   Positive social support and Positive therapeutic relationship  Continued Clinical Symptoms:  Irritable   Cognitive Features That Contribute To Risk:  None    Suicide Risk:  Minimal: No identifiable suicidal ideation.  Patients presenting with no risk factors but with morbid ruminations; may be classified as minimal risk based on the severity of the depressive symptoms    Plan Of Care/Follow-up recommendations:  Activity:  as tolerated Diet:  heart healthy diet  Garhett Bernhard, NP 05/23/2018, 10:00 AM

## 2018-05-23 NOTE — ED Provider Notes (Signed)
[facility]   CSN: [id number] Arrival date & time: 05/23/18  4098     History   Chief Complaint Chief Complaint  Patient presents with  . Ankle Pain  . Medical Clearance    HPI Kerri Parks is a 23 y.o. adult with history of oppositional defiant disorder, depression, bipolar 1 disorder, anxiety, asthma, anemia presenting for evaluation of acute onset, constant right foot pain since last night as well as worsening suicidal ideations.   He reports that while outside [employer] early this morning after smoking crack and drinking some beer he injured his right foot but is unsure how.  Denies head injury or loss of consciousness.  Its constant throbbing pain mostly to the lateral aspect of the right foot which radiates upward.  Has not been able to bear weight since the injury.  Notes some numbness and tingling to the right fourth and fifth toes.  No medications for this prior to arrival.  Notes some nausea associated with the pain.  Also endorses worsening suicidal ideations with plan to jump out into traffic.  Denies homicidal ideation.  Also endorses hearing voices but denies visual hallucinations.  Smokes cigarettes, occasionally smokes crack and drinks 1 beer daily.  Reports multiple stressors including being homeless and having been in foster care for most of his life.  The history is provided by the patient.    Past Medical History:  Diagnosis Date  . Anemia   . Anxiety   . Asthma   . Bipolar 1 disorder (HCC)   . Conduct disorder   . Depression   . Hypothyroidism 08/05/2013   Per pt report  . Oppositional defiant behavior   . Overdose 03/07/2016   intential     Patient Active Problem List   Diagnosis Date Noted  . Cocaine abuse with cocaine-induced mood disorder (HCC) 05/23/2018  . Alcohol abuse 05/23/2018  . MDD (major depressive disorder), recurrent episode, severe (HCC) 05/19/2018  . Bipolar disorder (HCC)  02/23/2017  . Borderline personality disorder (HCC) 02/22/2017  . Intentional overdose of drug in tablet form (HCC) 02/21/2017  . Suicidal ideation   . Bipolar affective disorder, depressed, mild (HCC) 03/09/2016  . Overdose 03/07/2016  . Encounter for central line placement   . Lithium toxicity   . Prolonged QT interval   . Suicide attempt (HCC)   . Food allergy 11/08/2015  . Foster child 11/08/2015  . Iron deficiency anemia 11/08/2015  . Asthma, chronic 08/19/2013    Past Surgical History:  Procedure Laterality Date  . UPPER GI ENDOSCOPY       OB History   No obstetric history on file.      Home Medications    Prior to Admission medications   Medication Sig Start Date End Date Taking? Authorizing Provider  acetaminophen (TYLENOL) 500 MG tablet Take 1 tablet (500 mg total) by mouth every 6 (six) hours as needed. 05/23/18   Makaiah Terwilliger A, PA-C  ibuprofen (ADVIL,MOTRIN) 600 MG tablet Take 1 tablet (600 mg total) by mouth every 6 (six) hours as needed. 05/23/18   Jeovanny Cuadros A, PA-C  LEVONORGESTREL-ETHINYL ESTRAD PO Take 1 tablet by mouth daily.  08/19/13  [provider]    Family History Family History  Family history unknown: Yes    Social History Social History   Tobacco Use  . Smoking status: Current Every Day Smoker    Packs/day: 0.25    Years: 2.00    Pack years: 0.50  Types: Cigarettes  . Smokeless tobacco: Never Used  Substance Use Topics  . Alcohol use: Yes    Alcohol/week: 5.0 standard drinks    Types: 5 Cans of beer per week    Comment: in the past  . Drug use: Yes    Types: Marijuana, Cocaine    Comment: "whenever she can get it"     Allergies   Peanut-containing drug products; Penicillins; Amoxicillin; and Food   Review of Systems Review of Systems  Constitutional: Negative for chills and fever.  Eyes: Negative for visual disturbance.  Gastrointestinal: Positive for nausea.  Musculoskeletal: Positive for arthralgias.  Skin:  Positive for color change.  Neurological: Positive for numbness. Negative for syncope, weakness and headaches.  All other systems reviewed and are negative.    Physical Exam Updated Vital Signs BP 115/62 (BP Location: Left Arm)   Pulse 91   Temp 98.4 F (36.9 C) (Oral)   Resp 16   SpO2 99%   Physical Exam Vitals signs and nursing note reviewed.  Constitutional:      General: He is not in acute distress.    Appearance: He is well-developed.  HENT:     Head: Normocephalic and atraumatic.     Comments: No Battle's signs, no raccoon's eyes, no rhinorrhea. No hemotympanum. No tenderness to palpation of the face or skull. No deformity, crepitus, or swelling noted.  Eyes:     General:        Right eye: No discharge.        Left eye: No discharge.     Conjunctiva/sclera: Conjunctivae normal.  Neck:     Musculoskeletal: Normal range of motion and neck supple.     Vascular: No JVD.     Trachea: No tracheal deviation.  Cardiovascular:     Rate and Rhythm: Normal rate.  Pulmonary:     Effort: Pulmonary effort is normal.  Abdominal:     General: There is no distension.  Musculoskeletal:        General: Swelling, tenderness and signs of injury present.     Comments: No midline spine TTP, no paraspinal muscle tenderness, no deformity, crepitus, or step-off noted.  Ecchymosis and swelling noted to the lateral aspect of the right foot.  Maximally tender to palpation overlying the right fifth metatarsal.  Slightly decreased range of motion of the right foot and ankle but 5/5 strength of bilateral lower extremity major muscle groups.  Examination of the Achilles tendon within normal limits  Skin:    General: Skin is warm and dry.     Capillary Refill: Capillary refill takes less than 2 seconds.     Findings: No erythema.  Neurological:     Mental Status: He is alert.     Comments: Fluent speech, no facial droop, slightly altered sensation to soft touch of the right fourth and fifth toes.   Otherwise sensation intact to soft touch of extremities.  Psychiatric:        Attention and Perception: He perceives auditory hallucinations.        Mood and Affect: Mood is depressed. Affect is blunt.        Behavior: Behavior is withdrawn. Behavior is cooperative.        Thought Content: Thought content includes suicidal ideation. Thought content does not include homicidal ideation. Thought content includes suicidal plan. Thought content does not include homicidal plan.      ED Treatments / Results  Labs (all labs ordered are listed, but only abnormal results are  displayed) Labs Reviewed  COMPREHENSIVE METABOLIC PANEL - Abnormal; Notable for the following components:      Result Value   Total Protein 8.3 (*)    All other components within normal limits  CBC WITH DIFFERENTIAL/PLATELET - Abnormal; Notable for the following components:   WBC 13.6 (*)    Neutro Abs 9.7 (*)    All other components within normal limits  ACETAMINOPHEN LEVEL - Abnormal; Notable for the following components:   Acetaminophen (Tylenol), Serum <10 (*)    All other components within normal limits  ETHANOL  SALICYLATE LEVEL  RAPID URINE DRUG SCREEN, HOSP PERFORMED  I-STAT BETA HCG BLOOD, ED ([facility], [facility], [facility] ONLY)    EKG None  Radiology Dg Ankle Complete Right  Result Date: 05/23/2018 CLINICAL DATA:  23 year old female with right ankle pain after recent fall EXAM: RIGHT ANKLE - COMPLETE 3+ VIEW COMPARISON:  None. FINDINGS: The ankle itself appears intact. There is no focal soft tissue swelling, evidence of fracture or malalignment. No ankle joint effusion. However, there is an incompletely imaged oblique fracture through the mid aspect of the fifth metatarsal. IMPRESSION: Partially imaged obliquely oriented fracture through the mid aspect of the fifth metatarsal. Recommend dedicated foot radiographs. No evidence of fracture, malalignment or joint effusion involving the ankle itself. Electronically Signed   By:  Malachy MoanHeath  McCullough M.D.   On: 05/23/2018 08:31   Dg Foot Complete Right  Result Date: 05/23/2018 CLINICAL DATA:  Patient states he fell. Pain, bruising and swelling on the lateral aspect of the right foot. EXAM: RIGHT FOOT COMPLETE - 3+ VIEW COMPARISON:  05/12/2012 FINDINGS: Oblique fracture of the midshaft fifth metatarsal, distal fracture fragment displaced medially 1-2 mm. No significant angulation. No intra-articular extension. No other bone abnormalities identified. IMPRESSION: Minimally displaced oblique fracture, midshaft fifth metatarsal. Electronically Signed   By: Corlis Leak  Hassell M.D.   On: 05/23/2018 09:08    Procedures Procedures (including critical care time)  Medications Ordered in ED Medications  sodium chloride 0.9 % bolus 1,000 mL (0 mLs Intravenous Stopped 05/23/18 1158)  morphine 4 MG/ML injection 4 mg (4 mg Intravenous Given 05/23/18 1040)     Initial Impression / Assessment and Plan / ED Course  I have reviewed the triage vital signs and the nursing notes.  Pertinent labs & imaging results that were available during my care of the patient were reviewed by me and considered in my medical decision making (see chart for details).     Patient presenting for evaluation of pain secondary to mechanical injury earlier today after smoking crack and drinking alcohol.  Also endorses suicidal ideation to me.  Patient is afebrile, vital signs are stable.  Nontoxic in appearance.  Neurovascularly intact and compartments are soft.  Radiographs show a minimally displaced oblique fracture of the midshaft fifth metatarsal.  Dr. Charlann Boxerlin with orthopedics recommends splint and follow-up with orthopedics this week.  Consulted with [facility] who recommends Cam walker due to unreliability of patient and likely noncompliance with splint.  Labs reviewed by me with nonspecific leukocytosis, otherwise reassuring.  She is medically cleared for TTS evaluation at this time.  TTS is seen and evaluated the  patient.  She denies any suicidal or homicidal ideation with them.  Our peer support specialists have seen the patient and recommended outpatient follow-up.  Patient ambulatory with crutches without difficulty. Discussed strict ED return precautions.Pt verbalized understanding of and agreement with plan and is safe for discharge home at this time.    Final Clinical Impressions(s) /  ED Diagnoses   Final diagnoses:  Cocaine abuse with cocaine-induced mood disorder (HCC)  Alcohol abuse  Closed displaced fracture of fifth metatarsal bone of right foot, initial encounter    ED Discharge Orders         Ordered    Increase activity slowly     05/23/18 1006    Diet - low sodium heart healthy     05/23/18 1006    Discharge instructions    Comments:  Follow up with [facility]   05/23/18 1006    ibuprofen (ADVIL,MOTRIN) 600 MG tablet  Every 6 hours PRN     05/23/18 1211    acetaminophen (TYLENOL) 500 MG tablet  Every 6 hours PRN     05/23/18 1211           Jeanie Sewer, PA-C 05/23/18 1542    Mancel Bale, MD 05/23/18 1658
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
## 2018-05-24 DIAGNOSIS — F1414 Cocaine abuse with cocaine-induced mood disorder: Secondary | ICD-10-CM

## 2018-05-24 MED ORDER — HYDROCODONE-ACETAMINOPHEN 5-325 MG PO TABS
2.0000 | ORAL_TABLET | Freq: Once | ORAL | Status: AC
  Administered 2018-05-24: 2 via ORAL
  Filled 2018-05-24: qty 2

## 2018-05-24 NOTE — Patient Outreach (Signed)
Pt was accepted to [facility] by Dr Roselyn Reef at 4:30 Pm today. Contact number for report, [phone]. Pt can arrive tomorrow after 8 am.

## 2018-05-24 NOTE — ED Notes (Signed)
Pt A&O x 3, no distress noted, calm & cooperative, watching TV at present.  Sitter at bedside.  Monitoring for safety.  [facility] after 8am in the morning.
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
## 2018-05-24 NOTE — ED Notes (Signed)
Patient resting in bed, calm, cooperative. 
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
## 2018-05-24 NOTE — ED Notes (Addendum)
[facility] Parks to review patient information per peer support

## 2018-05-25 NOTE — ED Notes (Signed)
NEAL FROM [facility] RECEIVED PATIENT. GIVEN CRUTCHES AND BELONGING BAGS X 2 AND DOCUMENTS. PT WITHOUT COMPLAINT. BONNIE SITTER PRESENT

## 2018-05-25 NOTE — ED Notes (Signed)
Report to RN Cherelle. 

## 2018-05-25 NOTE — ED Notes (Signed)
ADMISSION TO: [facility]

## 2018-05-25 NOTE — ED Notes (Addendum)
[facility] AWARE OF FX TO RLE AND PT WILL BE ON CRUTCHES. PT REQUEST TO BE CALLED " [name]"

## 2018-05-25 NOTE — BH Assessment (Signed)
[facility] Assessment Progress Note  Per Arlys John Degraphenreid's note dated 05/24/2018 @ 15:32, pt has been accepted to [facility] by Dr Roselyn Reef.  They will be ready to receive pt after 08:00 today.  Please call report to [phone].  Pt is currently under voluntary status.  This Clinical research associate has spoken to pt, who is in agreement with this plan.  Pt is to be transported via [facility].  Nanine Means, DNP, has been notified, and also agrees to the plan.  Pt's nurse, Morrie Sheldon, has also been notified.  Doylene Canning, [place] Behavioral Health Coordinator [phone]

## 2018-06-21 ENCOUNTER — Other Ambulatory Visit: Payer: Self-pay

## 2018-06-21 ENCOUNTER — Encounter (HOSPITAL_COMMUNITY): Payer: Self-pay

## 2018-06-21 ENCOUNTER — Emergency Department (HOSPITAL_COMMUNITY)
Admission: EM | Admit: 2018-06-21 | Discharge: 2018-06-22 | Disposition: A | Discharge status: Home / Self Care | Payer: No Typology Code available for payment source | Attending: Emergency Medicine | Admitting: Emergency Medicine

## 2018-06-21 DIAGNOSIS — Z91010 Allergy to peanuts: Secondary | ICD-10-CM | POA: Diagnosis not present

## 2018-06-21 DIAGNOSIS — J45909 Unspecified asthma, uncomplicated: Secondary | ICD-10-CM | POA: Diagnosis not present

## 2018-06-21 DIAGNOSIS — F3131 Bipolar disorder, current episode depressed, mild: Secondary | ICD-10-CM | POA: Diagnosis present

## 2018-06-21 DIAGNOSIS — F17210 Nicotine dependence, cigarettes, uncomplicated: Secondary | ICD-10-CM | POA: Diagnosis not present

## 2018-06-21 DIAGNOSIS — Z79899 Other long term (current) drug therapy: Secondary | ICD-10-CM | POA: Insufficient documentation

## 2018-06-21 DIAGNOSIS — F1010 Alcohol abuse, uncomplicated: Secondary | ICD-10-CM | POA: Diagnosis present

## 2018-06-21 DIAGNOSIS — E039 Hypothyroidism, unspecified: Secondary | ICD-10-CM | POA: Insufficient documentation

## 2018-06-21 DIAGNOSIS — F332 Major depressive disorder, recurrent severe without psychotic features: Secondary | ICD-10-CM | POA: Insufficient documentation

## 2018-06-21 DIAGNOSIS — F419 Anxiety disorder, unspecified: Secondary | ICD-10-CM | POA: Insufficient documentation

## 2018-06-21 DIAGNOSIS — F1414 Cocaine abuse with cocaine-induced mood disorder: Secondary | ICD-10-CM | POA: Diagnosis present

## 2018-06-21 DIAGNOSIS — F913 Oppositional defiant disorder: Secondary | ICD-10-CM | POA: Insufficient documentation

## 2018-06-21 DIAGNOSIS — R45851 Suicidal ideations: Secondary | ICD-10-CM

## 2018-06-21 DIAGNOSIS — F99 Mental disorder, not otherwise specified: Secondary | ICD-10-CM | POA: Diagnosis present

## 2018-06-21 LAB — COMPREHENSIVE METABOLIC PANEL
ALT: 34 U/L (ref 0–44)
AST: 59 U/L — ABNORMAL HIGH (ref 15–41)
Albumin: 4.4 g/dL (ref 3.5–5.0)
Alkaline Phosphatase: 67 U/L (ref 38–126)
Anion gap: 9 (ref 5–15)
BUN: 6 mg/dL (ref 6–20)
CALCIUM: 8.9 mg/dL (ref 8.9–10.3)
CO2: 24 mmol/L (ref 22–32)
Chloride: 110 mmol/L (ref 98–111)
Creatinine, Ser: 0.57 mg/dL (ref 0.44–1.00)
GFR calc Af Amer: >60 mL/min (ref >60)
Glucose, Bld: 80 mg/dL (ref 70–99)
Potassium: 3.1 mmol/L — ABNORMAL LOW (ref 3.5–5.1)
Sodium: 143 mmol/L (ref 135–145)
TOTAL PROTEIN: 7.2 g/dL (ref 6.5–8.1)
Total Bilirubin: 0.5 mg/dL (ref 0.3–1.2)

## 2018-06-21 LAB — CBC
HCT: 38 % (ref 36.0–46.0)
Hemoglobin: 12.3 g/dL (ref 12.0–15.0)
MCH: 29.8 pg (ref 26.0–34.0)
MCHC: 32.4 g/dL (ref 30.0–36.0)
MCV: 92 fL (ref 80.0–100.0)
Platelets: 279 10*3/uL (ref 150–400)
RBC: 4.13 MIL/uL (ref 3.87–5.11)
RDW: 12.7 % (ref 11.5–15.5)
WBC: 8 10*3/uL (ref 4.0–10.5)
nRBC: 0 % (ref 0.0–0.2)

## 2018-06-21 LAB — SALICYLATE LEVEL: Salicylate Lvl: <7 mg/dL (ref 2.8–30.0)

## 2018-06-21 LAB — I-STAT BETA HCG BLOOD, ED (MC, WL, AP ONLY): I-stat hCG, quantitative: <5 m[IU]/mL (ref <5)

## 2018-06-21 LAB — ETHANOL: ALCOHOL ETHYL (B): 67 mg/dL — AB (ref <10)

## 2018-06-21 LAB — ACETAMINOPHEN LEVEL: Acetaminophen (Tylenol), Serum: <10 ug/mL — ABNORMAL LOW (ref 10–30)

## 2018-06-21 NOTE — ED Triage Notes (Signed)
Pt BIBA from home. Pt recently dc from [facility]. Pt is SI with plans of self harm with a tree branch to cut arm.
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
## 2018-06-21 NOTE — ED Provider Notes (Addendum)
[facility]-EMERGENCY DEPT Provider Note   CSN: [id number] Arrival date & time: 06/21/18  1813   History   Chief Complaint Chief Complaint  Patient presents with  . Suicidal    HPI STEPAHNIE CAMPO is a 23 y.o. adult with past medical history significant for bipolar disorder, conduct disorder, depression, anxiety ODD, polysubstance abuse who presents for evaluation of SI.  Patient states he is homeless and "just wants it all to end."  Patient with SI with plan.  He plans to cut her wrist with a tree branch or walk into traffic.  He has had increased depression symptoms.  Notes 2 prior suicide attempts.  Was recently discharged from behavioral health.  States he has used crack cocaine yesterday evening.  Patient states he recently hurt his foot and " I don't care anymore."  Denies homicidal ideation.  Denies audiovisual hallucinations.  Admits to tobacco use.  Reports multiple stressors including homelessness.  Denies fever, chills, nausea, vomiting, chest pain, shortness of breath, cough, abdominal pain, diarrhea or dysuria.  History provided by patient.  No interpreter was used.  HPI  Past Medical History:  Diagnosis Date  . Anemia   . Anxiety   . Asthma   . Bipolar 1 disorder (HCC)   . Conduct disorder   . Depression   . Hypothyroidism 08/05/2013   Per pt report  . Oppositional defiant behavior   . Overdose 03/07/2016   intential     Patient Active Problem List   Diagnosis Date Noted  . Cocaine abuse with cocaine-induced mood disorder (HCC) 05/23/2018  . Alcohol abuse 05/23/2018  . MDD (major depressive disorder), recurrent episode, severe (HCC) 05/19/2018  . Bipolar disorder (HCC) 02/23/2017  . Borderline personality disorder (HCC) 02/22/2017  . Intentional overdose of drug in tablet form (HCC) 02/21/2017  . Suicidal ideation   . Bipolar affective disorder, depressed, mild (HCC) 03/09/2016  . Overdose 03/07/2016  . Encounter for central line  placement   . Lithium toxicity   . Prolonged QT interval   . Suicide attempt (HCC)   . Food allergy 11/08/2015  . Foster child 11/08/2015  . Iron deficiency anemia 11/08/2015  . Asthma, chronic 08/19/2013    Past Surgical History:  Procedure Laterality Date  . UPPER GI ENDOSCOPY       OB History   No obstetric history on file.      Home Medications    Prior to Admission medications   Medication Sig Start Date End Date Taking? Authorizing Provider  ALPRAZolam Prudy Feeler) 0.5 MG tablet Take 0.5 mg by mouth at bedtime. 05/04/18  Yes [provider]  cariprazine (VRAYLAR) capsule Take 1.5 mg by mouth daily.   Yes [provider]  DULoxetine (CYMBALTA) 60 MG capsule Take 60 mg by mouth daily.   Yes [provider]  ibuprofen (ADVIL,MOTRIN) 600 MG tablet Take 1 tablet (600 mg total) by mouth every 6 (six) hours as needed. Patient not taking: Reported on 06/21/2018 05/23/18   Michela Pitcher A, PA-C  LEVONORGESTREL-ETHINYL ESTRAD PO Take 1 tablet by mouth daily.  08/19/13  [provider]    Family History Family History  Family history unknown: Yes    Social History Social History   Tobacco Use  . Smoking status: Current Every Day Smoker    Packs/day: 0.25    Years: 2.00    Pack years: 0.50    Types: Cigarettes  . Smokeless tobacco: Never Used  Substance Use Topics  . Alcohol use:  Yes    Alcohol/week: 5.0 standard drinks    Types: 5 Cans of beer per week    Comment: in the past  . Drug use: Yes    Types: Marijuana, Cocaine    Comment: "whenever she can get it"     Allergies   Peanut-containing drug products; Penicillins; Amoxicillin; and Food   Review of Systems Review of Systems  Constitutional: Negative.   HENT: Negative.   Respiratory: Negative.   Cardiovascular: Negative.   Gastrointestinal: Negative.   Genitourinary: Negative.   Musculoskeletal: Negative.   Skin: Negative.   Neurological: Negative.     Psychiatric/Behavioral: Positive for self-injury, sleep disturbance and suicidal ideas. Negative for hallucinations. The patient is nervous/anxious. The patient is not hyperactive.   All other systems reviewed and are negative.    Physical Exam Updated Vital Signs BP 116/67 (BP Location: Left Arm)   Pulse 97   Temp 98.1 F (36.7 C) (Oral)   Resp 15   Ht 5\' 3"  (1.6 m)   Wt 56.7 kg   SpO2 98%   BMI 22.14 kg/m   Physical Exam Vitals signs and nursing note reviewed.  Constitutional:      General: He is not in acute distress.    Appearance: He is well-developed. He is not ill-appearing, toxic-appearing or diaphoretic.  HENT:     Head: Atraumatic.     Nose: Nose normal.     Mouth/Throat:     Mouth: Mucous membranes are moist.  Eyes:     Pupils: Pupils are equal, round, and reactive to light.  Neck:     Musculoskeletal: Normal range of motion.  Cardiovascular:     Rate and Rhythm: Normal rate.     Pulses: Normal pulses.     Heart sounds: Normal heart sounds.  Pulmonary:     Effort: Pulmonary effort is normal. No respiratory distress.     Breath sounds: Normal breath sounds.  Abdominal:     General: Bowel sounds are normal. There is no distension.     Palpations: Abdomen is soft.     Tenderness: There is no abdominal tenderness. There is no guarding or rebound.  Musculoskeletal: Normal range of motion.        General: No swelling, tenderness or deformity.     Right lower leg: No edema.     Left lower leg: No edema.     Comments: Patient with postop boot to right foot.  Patient refusing to remove this for examination.  Ambulatory in department that difficulty.  Skin:    General: Skin is warm and dry.  Neurological:     Mental Status: He is alert.  Psychiatric:        Mood and Affect: Mood is depressed. Affect is flat.        Speech: Speech normal.        Behavior: Behavior is withdrawn.        Thought Content: Thought content is not paranoid or delusional. Thought  content includes suicidal ideation. Thought content does not include homicidal ideation. Thought content includes suicidal plan. Thought content does not include homicidal plan.      ED Treatments / Results  Labs (all labs ordered are listed, but only abnormal results are displayed) Labs Reviewed  COMPREHENSIVE METABOLIC PANEL - Abnormal; Notable for the following components:      Result Value   Potassium 3.1 (*)    AST 59 (*)    All other components within normal limits  ETHANOL - Abnormal;  Notable for the following components:   Alcohol, Ethyl (B) 67 (*)    All other components within normal limits  ACETAMINOPHEN LEVEL - Abnormal; Notable for the following components:   Acetaminophen (Tylenol), Serum <10 (*)    All other components within normal limits  SALICYLATE LEVEL  CBC  RAPID URINE DRUG SCREEN, HOSP PERFORMED  I-STAT BETA HCG BLOOD, ED ([facility], [facility], [facility] ONLY)    EKG None  Radiology No results found.  Procedures Procedures (including critical care time)  Medications Ordered in ED Medications - No data to display   Initial Impression / Assessment and Plan / ED Course  I have reviewed the triage vital signs and the nursing notes.  Pertinent labs & imaging results that were available during my care of the patient were reviewed by me and considered in my medical decision making (see chart for details).  23 year old female to female transgender presents for evaluation of SI.  History of SI with 2 previous suicide attempts.  Patient's states she plans on cutting her wrist with a tree branch.  [facility]  Patient with recent stressors such as homelessness. Lungs clear to auscultation without wheeze, rhonchi or rales.  Abdomen soft, nontender without rebound or guarding.  Posterior oropharynx clear.  With postoperative boot to right foot, or injuring foot approximately 1 month ago.  Refusing to remove this for examination.  Patient able to ambulate  in department without difficulty.  Requesting food at this time.  Does state he smokes crack cocaine and and will occasionally drink alcohol.  Will obtain labs, urine, TTS consult and reevaluate.  CBC without leukocytosis, ethanol 67, hCG negative, significant, salicylate negative, metabolic panel with mild hypokalemia.  Patient refusing supplementation at this time.  UDS pending, however will not affect disposition.  Patient medically cleared. Hemodynamically stable.  Behavioral health with recommendations for overnight observation and reassess by psychiatry in the morning.  Patient is currently not under involuntary commitment.  If patient tries to leave department prior to psychiatry reevaluate patient will need to be IVCd.    Final Clinical Impressions(s) / ED Diagnoses   Final diagnoses:  Suicidal ideation    ED Discharge Orders    None          Tomiko Schoon A, PA-C 06/21/18 2328    Loren RacerYelverton, David, MD 06/22/18 2332

## 2018-06-21 NOTE — Progress Notes (Signed)
[name], NP pt is recommended for continued observation for safety and stabilization and to be reassessed in the AM by psych due to hx of suicide attempts and current SI. EDP Henderly, Britni A, PA-C and pt's nurse Joanie Coddington, RN have been advised.   Princess Bruins, MSW, LCSW Therapeutic Triage Specialist  [phone]

## 2018-06-21 NOTE — ED Notes (Signed)
Bed: WLPT3 Expected date:  Expected time:  Means of arrival:  Comments: 

## 2018-06-21 NOTE — Discharge Instructions (Addendum)
For your behavioral health needs, you were recently referred to [facility] to enroll in their ACT Team program.  Contact them at your earliest opportunity to ask about this:       [facility]      [address], [address]      [place], [place] [address]      [phone]  In the meantime, follow up with [facility] for your behavioral health needs:       [facility]      [address]      [place], [place] [address]      [phone]

## 2018-06-21 NOTE — ED Notes (Signed)
Bed: WBH36 Expected date:  Expected time:  Means of arrival:  Comments: Triage 3 

## 2018-06-21 NOTE — ED Notes (Signed)
Pt is refusing to be wanded.

## 2018-06-21 NOTE — ED Notes (Signed)
Pt A&O x 3, no distress noted, cooperative but irritable, presents with superficial abrasions to left forearm, self inflicted.  Denies HI or AVH.  Meal given.  Monitoring for safety, Q 15 min checks in effect.

## 2018-06-22 DIAGNOSIS — F1414 Cocaine abuse with cocaine-induced mood disorder: Secondary | ICD-10-CM

## 2018-06-22 NOTE — ED Notes (Signed)
Patient is alert and oriented x 4.  Presents with irritable affect and mood. Denies suicidal thoughts, auditory and visual hallucinations.  Discharge instruction reviewed with patient.  Verbalizes understanding of discharge process.  Personal belonging returned to patient upon discharge.

## 2018-06-22 NOTE — BH Assessment (Addendum)
[facility] Assessment Progress Note  Per Juanetta Beets, DO, this pt does not require psychiatric hospitalization at this time.  Pt is to be discharged from [facility].  Pt was recently referred to [facility] to enroll in their ACT Team program.  Discharge instructions advise pt to follow through with this referral, offering contact information for [facility] as a back-up plan.  Pt would also benefit from seeing Peer Support Specialists; they will be asked to speak to pt.  Pt's nurse, Lanora Manis, has been notified.  Doylene Canning, MA Triage Specialist [phone]

## 2018-06-22 NOTE — Consult Note (Addendum)
[facility] Psych ED Discharge  06/22/2018 12:42 PM Kerri Parks  MRN:  [id number] Principal Problem: Cocaine abuse with cocaine-induced mood disorder [facility]) Discharge Diagnoses: Principal Problem:   Cocaine abuse with cocaine-induced mood disorder ([place]) Active Problems:   Bipolar affective disorder, depressed, mild ([place])   Alcohol abuse  Subjective: 23 yo female to female transgender client who came to the ED after using cocaine and having self-harm thoughts with a tree branch.  Today, he is clear and coherent with no suicidal/homicidal ideations, hallucinations, or withdrawal symptoms.  He was at [facility] and [facility] in the past month.  He was recently released from prison with a felon court date on 2/21 for felon possession of drugs.  Unfortunately, he continues to use and return to the ED without using his outpatient resources. Drug use increases with money and decreases when in the hospital or rehab.  He has had several opportunities but gets in trouble at the facilities and discharged.  Peer support met with him to provide resources, stable for discharge.  Total Time spent with patient: 45 minutes  Past Psychiatric History: substance abuse, conduct disorder, depression  Past Medical History:  Past Medical History:  Diagnosis Date  . Anemia   . Anxiety   . Asthma   . Bipolar 1 disorder ([place])   . Conduct disorder   . Depression   . Hypothyroidism 08/05/2013   Per pt report  . Oppositional defiant behavior   . Overdose 03/07/2016   intential     Past Surgical History:  Procedure Laterality Date  . UPPER GI ENDOSCOPY     Family History:  Family History  Family history unknown: Yes   Family Psychiatric  History: none Social History:  Social History   Substance and Sexual Activity  Alcohol Use Yes  . Alcohol/week: 5.0 standard drinks  . Types: 5 Cans of beer per week   Comment: in the past     Social History   Substance and Sexual Activity  Drug Use Yes  . Types:  Marijuana, Cocaine   Comment: "whenever she can get it"    Social History   Socioeconomic History  . Marital status: Single    Spouse name: Not on file  . Number of children: Not on file  . Years of education: Not on file  . Highest education level: Not on file  Occupational History  . Not on file  Social Needs  . Financial resource strain: Not on file  . Food insecurity:    Worry: Not on file    Inability: Not on file  . Transportation needs:    Medical: Not on file    Non-medical: Not on file  Tobacco Use  . Smoking status: Current Every Day Smoker    Packs/day: 0.25    Years: 2.00    Pack years: 0.50    Types: Cigarettes  . Smokeless tobacco: Never Used  Substance and Sexual Activity  . Alcohol use: Yes    Alcohol/week: 5.0 standard drinks    Types: 5 Cans of beer per week    Comment: in the past  . Drug use: Yes    Types: Marijuana, Cocaine    Comment: "whenever she can get it"  . Sexual activity: Not Currently  Lifestyle  . Physical activity:    Days per week: Not on file    Minutes per session: Not on file  . Stress: Not on file  Relationships  . Social connections:    Talks on phone:  Not on file    Gets together: Not on file    Attends religious service: Not on file    Active member of club or organization: Not on file    Attends meetings of clubs or organizations: Not on file    Relationship status: Not on file  Other Topics Concern  . Not on file  Social History Narrative  . Not on file    Has this patient used any form of tobacco in the last 30 days? (Cigarettes, Smokeless Tobacco, Cigars, and/or Pipes) A prescription for an FDA-approved tobacco cessation medication was offered at discharge and the patient refused  Current Medications: No current facility-administered medications for this encounter.    Current Outpatient Medications  Medication Sig Dispense Refill  . ALPRAZolam (XANAX) 0.5 MG tablet Take 0.5 mg by mouth at bedtime.    .  cariprazine (VRAYLAR) capsule Take 1.5 mg by mouth daily.    . DULoxetine (CYMBALTA) 60 MG capsule Take 60 mg by mouth daily.    Marland Kitchen ibuprofen (ADVIL,MOTRIN) 600 MG tablet Take 1 tablet (600 mg total) by mouth every 6 (six) hours as needed. (Patient not taking: Reported on 06/21/2018) 30 tablet 0   PTA Medications: (Not in a hospital admission)   Musculoskeletal: Strength & Muscle Tone: within normal limits Gait & Station: normal Patient leans: N/A  Psychiatric Specialty Exam: Physical Exam  Nursing note and vitals reviewed. Constitutional: He is oriented to person, place, and time. He appears well-developed and well-nourished.  HENT:  Head: Normocephalic.  Neck: Normal range of motion.  Respiratory: Effort normal.  Musculoskeletal: Normal range of motion.  Neurological: He is alert and oriented to person, place, and time.  Psychiatric: His speech is normal and behavior is normal. Judgment and thought content normal. His mood appears anxious. His affect is blunt. Cognition and memory are normal.    Review of Systems  Psychiatric/Behavioral: Positive for substance abuse. The patient is nervous/anxious.   All other systems reviewed and are negative.   Blood pressure 99/60, pulse 62, temperature 98 F (36.7 C), temperature source Oral, resp. rate 14, height _0  (1.6 m), weight 56.7 kg, SpO2 98 %.Body mass index is 22.14 kg/m.  General Appearance: Casual  Eye Contact:  Good  Speech:  Normal Rate  Volume:  Normal  Mood:  Anxious  Affect:  Blunt  Thought Process:  Coherent and Descriptions of Associations: Intact  Orientation:  Full (Time, Place, and Person)  Thought Content:  WDL and Logical  Suicidal Thoughts:  No  Homicidal Thoughts:  No  Memory:  Immediate;   Good Recent;   Good Remote;   Good  Judgement:  Fair  Insight:  Fair  Psychomotor Activity:  Normal  Concentration:  Concentration: Good and Attention Span: Good  Recall:  Good  Fund of Knowledge:  Fair  Language:   Good  Akathisia:  No  Handed:  Right  AIMS (if indicated):   N/A  Assets:  Leisure Time Physical Health Resilience  ADL's:  Intact  Cognition:  WNL  Sleep:   N/A     Demographic Factors:  Adolescent or young adult and Gay, lesbian, or bisexual orientation  Loss Factors: Legal issues  Historical Factors: NA  Risk Reduction Factors:   Positive social support and Positive therapeutic relationship  Continued Clinical Symptoms:  Anxiety, mild  Cognitive Features That Contribute To Risk:  None    Suicide Risk:  Minimal: No identifiable suicidal ideation.  Patients presenting with no risk factors but with  morbid ruminations; may be classified as minimal risk based on the severity of the depressive symptoms   Plan Of Care/Follow-up recommendations:  Cocaine abuse with cocaine induced mood disorder: -Peer consult placed, outpatient resources provided  Activity:  as tolerated Diet:  heart healthy diet  Disposition: discharge home Waylan Boga, NP 06/22/2018, 12:42 PM   Patient seen face-to-face for psychiatric evaluation, chart reviewed and case discussed with the physician extender and developed treatment plan. Reviewed the information documented and agree with the treatment plan.  Buford Dresser, DO 06/22/18 1:05 PM

## 2020-10-07 IMAGING — CR DG ABDOMEN 1V
2 series · 2 of 2 positions shown · non-contrast
Comparison: None.

CLINICAL DATA: Swallowed a staple.

EXAM:
ABDOMEN - 1 VIEW

[t abdomen supine (1 of 2)]
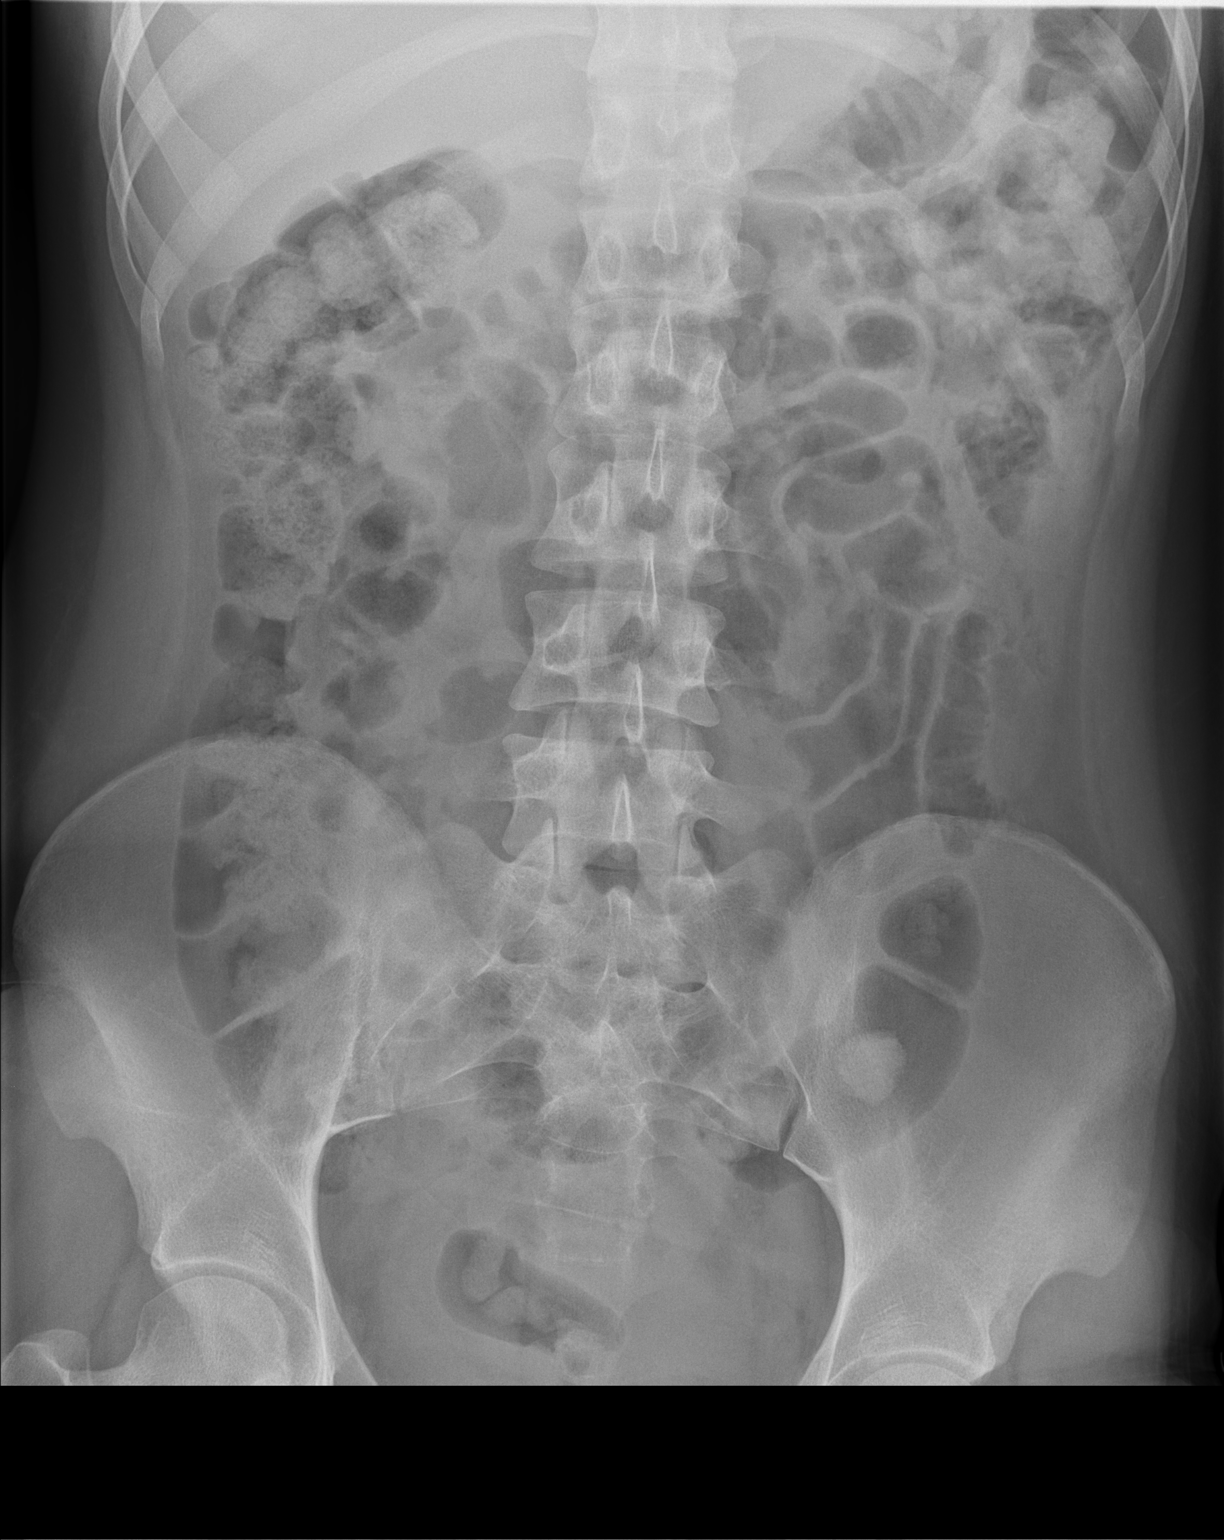

[t abdomen supine (2 of 2)]
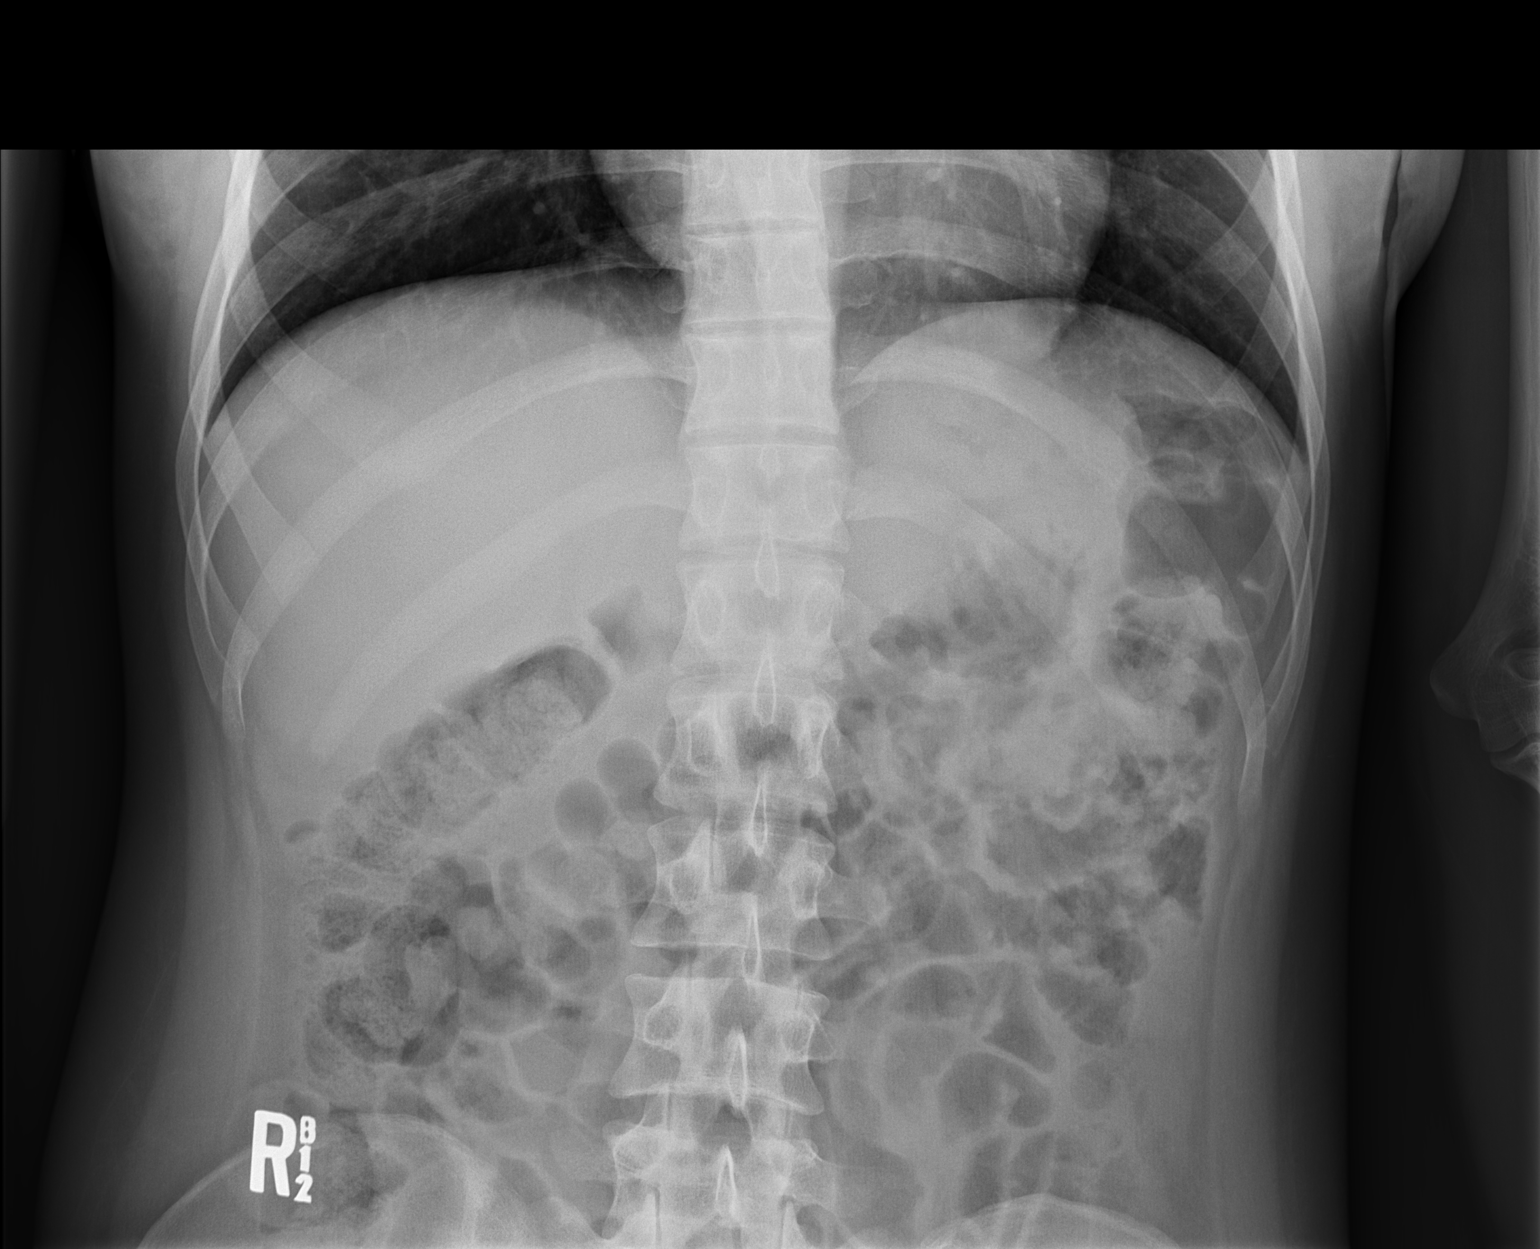

[2 of 2 positions shown; findings below may reference images not displayed]

FINDINGS: The bowel gas pattern is normal. No radio-opaque calculi or other
significant radiographic abnormality are seen.
IMPRESSION: No metallic foreign body over the abdomen.
